# Patient Record
Sex: Female | Born: 1985 | Race: White | Hispanic: No | Marital: Married | Smoking: Never smoker
Health system: Southern US, Community
[De-identification: ages and names within clinical notes are randomized; demographics above are authoritative.]

## PROBLEM LIST (undated history)

## (undated) DIAGNOSIS — G4730 Sleep apnea, unspecified: Secondary | ICD-10-CM

## (undated) DIAGNOSIS — D649 Anemia, unspecified: Secondary | ICD-10-CM

## (undated) HISTORY — PX: NASAL SEPTUM SURGERY: SHX37

## (undated) HISTORY — PX: COLONOSCOPY W/ BIOPSIES AND POLYPECTOMY: SHX1376

---

## 2020-04-05 DIAGNOSIS — K635 Polyp of colon: Secondary | ICD-10-CM | POA: Insufficient documentation

## 2020-04-07 DIAGNOSIS — E559 Vitamin D deficiency, unspecified: Secondary | ICD-10-CM | POA: Insufficient documentation

## 2020-06-23 ENCOUNTER — Emergency Department: Payer: BLUE CROSS/BLUE SHIELD

## 2020-06-23 ENCOUNTER — Other Ambulatory Visit: Payer: Self-pay

## 2020-06-23 DIAGNOSIS — O209 Hemorrhage in early pregnancy, unspecified: Secondary | ICD-10-CM

## 2020-06-23 DIAGNOSIS — O4691 Antepartum hemorrhage, unspecified, first trimester: Secondary | ICD-10-CM | POA: Diagnosis present

## 2020-06-23 DIAGNOSIS — O200 Threatened abortion: Secondary | ICD-10-CM | POA: Insufficient documentation

## 2020-06-23 LAB — URINALYSIS, COMPLETE (UACMP) WITH MICROSCOPIC
Bacteria, UA: NONE SEEN
Bilirubin Urine: NEGATIVE
Glucose, UA: NEGATIVE mg/dL
Ketones, ur: 5 mg/dL — AB
Leukocytes,Ua: NEGATIVE
Nitrite: NEGATIVE
Protein, ur: NEGATIVE mg/dL
RBC / HPF: >50 RBC/hpf — ABNORMAL HIGH (ref 0–5)
Specific Gravity, Urine: 1.023 (ref 1.005–1.030)
pH: 6 (ref 5.0–8.0)

## 2020-06-23 LAB — CBC
HCT: 38 % (ref 36.0–46.0)
Hemoglobin: 12.6 g/dL (ref 12.0–15.0)
MCH: 30.1 pg (ref 26.0–34.0)
MCHC: 33.2 g/dL (ref 30.0–36.0)
MCV: 90.9 fL (ref 80.0–100.0)
Platelets: 306 10*3/uL (ref 150–400)
RBC: 4.18 MIL/uL (ref 3.87–5.11)
RDW: 12.6 % (ref 11.5–15.5)
WBC: 7.6 10*3/uL (ref 4.0–10.5)
nRBC: 0 % (ref 0.0–0.2)

## 2020-06-23 LAB — COMPREHENSIVE METABOLIC PANEL
ALT: 14 U/L (ref 0–44)
AST: 18 U/L (ref 15–41)
Albumin: 4.5 g/dL (ref 3.5–5.0)
Alkaline Phosphatase: 56 U/L (ref 38–126)
Anion gap: 8 (ref 5–15)
BUN: 15 mg/dL (ref 6–20)
CO2: 25 mmol/L (ref 22–32)
Calcium: 9.1 mg/dL (ref 8.9–10.3)
Chloride: 104 mmol/L (ref 98–111)
Creatinine, Ser: 0.71 mg/dL (ref 0.44–1.00)
GFR, Estimated: >60 mL/min (ref >60)
Glucose, Bld: 126 mg/dL — ABNORMAL HIGH (ref 70–99)
Potassium: 3.8 mmol/L (ref 3.5–5.1)
Sodium: 137 mmol/L (ref 135–145)
Total Bilirubin: 0.6 mg/dL (ref 0.3–1.2)
Total Protein: 7 g/dL (ref 6.5–8.1)

## 2020-06-23 LAB — HCG, QUANTITATIVE, PREGNANCY: hCG, Beta Chain, Quant, S: 703 m[IU]/mL — ABNORMAL HIGH (ref <5)

## 2020-06-23 LAB — POC URINE PREG, ED: Preg Test, Ur: POSITIVE — AB

## 2020-06-23 NOTE — ED Triage Notes (Signed)
Pt in with co right groin pain and states has had some vaginal bleeding for 2 weeks. States bleeding got heavier 2-3 days ago and pain has worsened today. Pt took pregnancy test today that was positive.

## 2020-06-24 ENCOUNTER — Emergency Department
Admission: EM | Admit: 2020-06-24 | Discharge: 2020-06-24 | Disposition: A | Discharge status: Home / Self Care | Payer: BLUE CROSS/BLUE SHIELD | Attending: Emergency Medicine | Admitting: Emergency Medicine

## 2020-06-24 DIAGNOSIS — O200 Threatened abortion: Secondary | ICD-10-CM

## 2020-06-24 DIAGNOSIS — O209 Hemorrhage in early pregnancy, unspecified: Secondary | ICD-10-CM

## 2020-06-24 DIAGNOSIS — R102 Pelvic and perineal pain: Secondary | ICD-10-CM

## 2020-06-24 LAB — CHLAMYDIA/NGC RT PCR (ARMC ONLY)
Chlamydia Tr: NOT DETECTED
N gonorrhoeae: NOT DETECTED

## 2020-06-24 LAB — WET PREP, GENITAL
Clue Cells Wet Prep HPF POC: NONE SEEN
Sperm: NONE SEEN
Trich, Wet Prep: NONE SEEN
Yeast Wet Prep HPF POC: NONE SEEN

## 2020-06-24 LAB — ABO/RH: ABO/RH(D): B POS

## 2020-06-24 NOTE — Discharge Instructions (Addendum)
Do not use tampons, douching or have sexual intercourse until seen by your doctor.  Return to the ER for worsening symptoms, soaking more than 1 pad per hour, fainting or other concerns.

## 2020-06-24 NOTE — ED Provider Notes (Signed)
[facility] Emergency Department Provider Note   ____________________________________________   Event Date/Time   First MD Initiated Contact with Patient 06/24/20 0121     (approximate)  I have reviewed the triage vital signs and the nursing notes.   HISTORY  Chief Complaint Groin Pain    HPI Yvette Thompson is a 35 y.o. female G1, P0 who presents to the ED from home with a chief complaint of vaginal bleeding and right adnexal pain.  LMP 05/30/2020.  Reports vaginal bleeding x14 days, heavier and associated with pelvic cramping today.  Took a pregnancy test today which was positive.  Has an appointment with her OB/GYN tomorrow.  Took ibuprofen and Tylenol while awaiting treatment room which has alleviated her pain.  Denies fever, cough, chest pain, shortness of breath, nausea, vomiting or dysuria.     Past medical history None  There are no problems to display for this patient.   Prior to Admission medications   Not on File    Allergies Patient has no allergy information on record.  No family history on file.  Social History    Review of Systems  Constitutional: No fever/chills Eyes: No visual changes. ENT: No sore throat. Cardiovascular: Denies chest pain. Respiratory: Denies shortness of breath. Gastrointestinal: No abdominal pain.  No nausea, no vomiting.  No diarrhea.  No constipation. Genitourinary: Positive for vaginal bleeding.  Negative for dysuria. Musculoskeletal: Negative for back pain. Skin: Negative for rash. Neurological: Negative for headaches, focal weakness or numbness.   ____________________________________________   PHYSICAL EXAM:  VITAL SIGNS: ED Triage Vitals  Enc Vitals Group     BP 06/23/20 2017 140/81     Pulse Rate 06/23/20 2016 88     Resp 06/23/20 2016 18     Temp 06/23/20 2016 99.7 F (37.6 C)     Temp Source 06/23/20 2016 Oral     SpO2 06/23/20 2016 100 %     Weight 06/23/20 2016 170 lb  (77.1 kg)     Height 06/23/20 2016 5\' 7"  (1.702 m)     Head Circumference --      Peak Flow --      Pain Score 06/23/20 2016 7     Pain Loc --      Pain Edu? --      Excl. in GC? --     Constitutional: Alert and oriented. Well appearing and in no acute distress. Eyes: Conjunctivae are normal. PERRL. EOMI. Head: Atraumatic. Nose: No congestion/rhinnorhea. Mouth/Throat: Mucous membranes are moist.   Neck: No stridor.   Cardiovascular: Normal rate, regular rhythm. Grossly normal heart sounds.  Good peripheral circulation. Respiratory: Normal respiratory effort.  No retractions. Lungs CTAB. Gastrointestinal: Soft and nontender to light or deep palpation. No distention. No abdominal bruits. No CVA tenderness. Pelvic: External exam unremarkable without masses, lesions or vesicles.  Speculum exam reveals mild vaginal bleeding.  Cervical os is closed.  Bimanual exam unremarkable. Musculoskeletal: No lower extremity tenderness nor edema.  No joint effusions. Neurologic:  Normal speech and language. No gross focal neurologic deficits are appreciated. No gait instability. Skin:  Skin is warm, dry and intact. No rash noted. Psychiatric: Mood and affect are normal. Speech and behavior are normal.  ____________________________________________   LABS (all labs ordered are listed, but only abnormal results are displayed)  Labs Reviewed  WET PREP, GENITAL - Abnormal; Notable for the following components:      Result Value   WBC, Wet Prep HPF POC RARE (*)  All other components within normal limits  COMPREHENSIVE METABOLIC PANEL - Abnormal; Notable for the following components:   Glucose, Bld 126 (*)    All other components within normal limits  HCG, QUANTITATIVE, PREGNANCY - Abnormal; Notable for the following components:   hCG, Beta Chain, Quant, S 703 (*)    All other components within normal limits  URINALYSIS, COMPLETE (UACMP) WITH MICROSCOPIC - Abnormal; Notable for the following  components:   Color, Urine YELLOW (*)    APPearance HAZY (*)    Hgb urine dipstick LARGE (*)    Ketones, ur 5 (*)    RBC / HPF >50 (*)    All other components within normal limits  POC URINE PREG, ED - Abnormal; Notable for the following components:   Preg Test, Ur POSITIVE (*)    All other components within normal limits  CHLAMYDIA/NGC RT PCR ([facility] ONLY)  CBC  ABO/RH   ____________________________________________  EKG  None ____________________________________________  RADIOLOGY I, Yvette Thompson J, personally viewed and evaluated these images (plain radiographs) as part of my medical decision making, as well as reviewing the written report by the radiologist.  ED MD interpretation: No IUP visualized  Official radiology report(s): US OB LESS THAN 14 WEEKS WITH OB TRANSVAGINAL  Result Date: 06/23/2020 CLINICAL DATA:  Bleeding EXAM: OBSTETRIC <14 WK [place] AND TRANSVAGINAL OB US TECHNIQUE: Both transabdominal and transvaginal ultrasound examinations were performed for complete evaluation of the gestation as well as the maternal uterus, adnexal regions, and pelvic cul-de-sac. Transvaginal technique was performed to assess early pregnancy. COMPARISON:  None. FINDINGS: Intrauterine gestational sac: None Yolk sac:  Not Visualized. Embryo:  Not Visualized. Cardiac Activity: Not Visualized. Heart Rate:   bpm MSD:   mm    w     d CRL:    mm    w    d                  [place] EDC: Subchorionic hemorrhage:  None visualized. Maternal uterus/adnexae: No adnexal mass. Trace cul-de-sac free fluid and fluid in the endometrium. Uterus is retroverted. IMPRESSION: No intrauterine pregnancy visualized. Differential considerations would include early intrauterine pregnancy too early to visualize, spontaneous abortion, or occult ectopic pregnancy. Recommend close clinical followup and serial quantitative beta HCGs and ultrasounds. Electronically Signed   By: Charlett Nose M.D.   On: 06/23/2020 21:21     ____________________________________________   PROCEDURES  Procedure(s) performed (including Critical Care):  Procedures   ____________________________________________   INITIAL IMPRESSION / ASSESSMENT AND PLAN / ED COURSE  As part of my medical decision making, I reviewed the following data within the electronic MEDICAL RECORD NUMBER History obtained from family, Nursing notes reviewed and incorporated, Labs reviewed, Old chart reviewed, Radiograph reviewed and Notes from prior ED visits     35 year old G1, P0 presenting with vaginal bleeding x2 weeks now with pelvic cramping, right greater than left. Differential diagnosis includes, but is not limited to, ovarian cyst, ovarian torsion, acute appendicitis, diverticulitis, urinary tract infection/pyelonephritis, endometriosis, bowel obstruction, colitis, renal colic, gastroenteritis, hernia, fibroids, endometriosis, pregnancy related pain including ectopic pregnancy, etc.  Discussed with patient and spouse laboratory and imaging results.  She has an appointment tomorrow with her OB/GYN for beta hCG.  She knows she is O blood type but unsure positive or negative.  Will obtain ABO/Rh.  Clinical Course as of 06/24/20 0549  Tue Jun 24, 2020  0322 Updated patient and spouse on wet prep and culture results.  Will print all  test results for patient to take to her doctor tomorrow.  Pelvic rest precautions given.  Both verbalized understanding agree with plan of care. [JS]    Clinical Course User Index [JS] Irean Hong, MD     ____________________________________________   FINAL CLINICAL IMPRESSION(S) / ED DIAGNOSES  Final diagnoses:  Threatened miscarriage in early pregnancy  Pelvic pain     ED Discharge Orders    None      *Please note:  Ethelle Ola was evaluated in Emergency Department on 06/24/2020 for the symptoms described in the history of present illness. She was evaluated in the context of the global COVID-19  pandemic, which necessitated consideration that the patient might be at risk for infection with the SARS-CoV-2 virus that causes COVID-19. Institutional protocols and algorithms that pertain to the evaluation of patients at risk for COVID-19 are in a state of rapid change based on information released by regulatory bodies including the CDC and federal and state organizations. These policies and algorithms were followed during the patient's care in the ED.  Some ED evaluations and interventions may be delayed as a result of limited staffing during and the pandemic.*   Note:  This document was prepared using Dragon voice recognition software and may include unintentional dictation errors.   Irean Hong, MD 06/24/20 [phone]

## 2021-02-24 ENCOUNTER — Other Ambulatory Visit: Payer: Self-pay | Admitting: Obstetrics and Gynecology

## 2021-02-24 DIAGNOSIS — N6320 Unspecified lump in the left breast, unspecified quadrant: Secondary | ICD-10-CM

## 2021-03-09 ENCOUNTER — Ambulatory Visit
Admission: RE | Admit: 2021-03-09 | Discharge: 2021-03-09 | Disposition: A | Discharge status: Home / Self Care | Payer: BC Managed Care – PPO | Source: Ambulatory Visit | Attending: Obstetrics and Gynecology | Admitting: Obstetrics and Gynecology

## 2021-03-09 ENCOUNTER — Other Ambulatory Visit: Payer: Self-pay

## 2021-03-09 DIAGNOSIS — N6320 Unspecified lump in the left breast, unspecified quadrant: Secondary | ICD-10-CM | POA: Diagnosis present

## 2021-03-09 DIAGNOSIS — N6310 Unspecified lump in the right breast, unspecified quadrant: Secondary | ICD-10-CM | POA: Insufficient documentation

## 2021-04-20 DIAGNOSIS — O00101 Right tubal pregnancy without intrauterine pregnancy: Secondary | ICD-10-CM | POA: Insufficient documentation

## 2021-07-09 DIAGNOSIS — O09513 Supervision of elderly primigravida, third trimester: Secondary | ICD-10-CM | POA: Insufficient documentation

## 2021-08-06 LAB — OB RESULTS CONSOLE RUBELLA ANTIBODY, IGM: Rubella: NON-IMMUNE/NOT IMMUNE

## 2021-08-06 LAB — OB RESULTS CONSOLE RPR: RPR: NONREACTIVE

## 2021-08-06 LAB — OB RESULTS CONSOLE HEPATITIS B SURFACE ANTIGEN: Hepatitis B Surface Ag: NEGATIVE

## 2021-08-06 LAB — OB RESULTS CONSOLE VARICELLA ZOSTER ANTIBODY, IGG: Varicella: IMMUNE

## 2021-12-16 DIAGNOSIS — G4730 Sleep apnea, unspecified: Secondary | ICD-10-CM | POA: Insufficient documentation

## 2022-01-16 ENCOUNTER — Encounter: Payer: Self-pay | Admitting: Obstetrics and Gynecology

## 2022-01-16 ENCOUNTER — Other Ambulatory Visit: Payer: Self-pay

## 2022-01-16 ENCOUNTER — Observation Stay
Admission: EM | Admit: 2022-01-16 | Discharge: 2022-01-16 | Disposition: A | Discharge status: Home / Self Care | Payer: BC Managed Care – PPO | Source: Home / Self Care | Admitting: Obstetrics and Gynecology

## 2022-01-16 DIAGNOSIS — Z3A35 35 weeks gestation of pregnancy: Secondary | ICD-10-CM | POA: Insufficient documentation

## 2022-01-16 DIAGNOSIS — O1493 Unspecified pre-eclampsia, third trimester: Secondary | ICD-10-CM | POA: Insufficient documentation

## 2022-01-16 DIAGNOSIS — O1414 Severe pre-eclampsia complicating childbirth: Secondary | ICD-10-CM | POA: Diagnosis not present

## 2022-01-16 DIAGNOSIS — Z79899 Other long term (current) drug therapy: Secondary | ICD-10-CM | POA: Insufficient documentation

## 2022-01-16 DIAGNOSIS — O09523 Supervision of elderly multigravida, third trimester: Secondary | ICD-10-CM | POA: Insufficient documentation

## 2022-01-16 HISTORY — DX: Sleep apnea, unspecified: G47.30

## 2022-01-16 HISTORY — DX: Anemia, unspecified: D64.9

## 2022-01-16 LAB — COMPREHENSIVE METABOLIC PANEL
ALT: 33 U/L (ref 0–44)
AST: 35 U/L (ref 15–41)
Albumin: 2.7 g/dL — ABNORMAL LOW (ref 3.5–5.0)
Alkaline Phosphatase: 100 U/L (ref 38–126)
Anion gap: 6 (ref 5–15)
BUN: 11 mg/dL (ref 6–20)
CO2: 22 mmol/L (ref 22–32)
Calcium: 8.4 mg/dL — ABNORMAL LOW (ref 8.9–10.3)
Chloride: 107 mmol/L (ref 98–111)
Creatinine, Ser: 0.77 mg/dL (ref 0.44–1.00)
GFR, Estimated: >60 mL/min (ref >60)
Glucose, Bld: 107 mg/dL — ABNORMAL HIGH (ref 70–99)
Potassium: 4.4 mmol/L (ref 3.5–5.1)
Sodium: 135 mmol/L (ref 135–145)
Total Bilirubin: 0.4 mg/dL (ref 0.3–1.2)
Total Protein: 5.2 g/dL — ABNORMAL LOW (ref 6.5–8.1)

## 2022-01-16 LAB — CBC
HCT: 30.1 % — ABNORMAL LOW (ref 36.0–46.0)
Hemoglobin: 10.1 g/dL — ABNORMAL LOW (ref 12.0–15.0)
MCH: 29.9 pg (ref 26.0–34.0)
MCHC: 33.6 g/dL (ref 30.0–36.0)
MCV: 89.1 fL (ref 80.0–100.0)
Platelets: 190 10*3/uL (ref 150–400)
RBC: 3.38 MIL/uL — ABNORMAL LOW (ref 3.87–5.11)
RDW: 14.2 % (ref 11.5–15.5)
WBC: 8.6 10*3/uL (ref 4.0–10.5)
nRBC: 0 % (ref 0.0–0.2)

## 2022-01-16 LAB — PROTEIN / CREATININE RATIO, URINE
Creatinine, Urine: 208 mg/dL
Protein Creatinine Ratio: 0.35 mg/mg{Cre} — ABNORMAL HIGH (ref 0.00–0.15)
Total Protein, Urine: 73 mg/dL

## 2022-01-16 NOTE — OB Triage Note (Signed)
Discharge instructions reviewed and follow-up care discussed. Pt discharged by CNM and RN. Red flag labor precautions reviewed and all questions answered. Pt stable at the time of discharge with spouse.

## 2022-01-16 NOTE — OB Triage Note (Signed)
Pt is a G2P0 and [redacted]w[redacted]d presenting to L&D with c/o elevated blood pressures at home. Pt denies headache, blurry vision, epigastric pain, LOF, bleeding, ctx and confirms positive fetal movement. Pt states blood pressures at home were "150s/140s over 90s." Initial BP 160/90. Monitors applied and CNM notified of pts arrival.

## 2022-01-16 NOTE — Discharge Summary (Signed)
Yvette Thompson is a 36 y.o. female. She is at 17w0dgestation. Patient's last menstrual period was 03/07/2021. Estimated Date of Delivery: 02/20/22  [facility]  [facility] Rubella non-immune; Recommend MMR postpartum   Chief complaint: elevated BP at home, reports BP last night 150/99, AM 150/90s; Denies HA, RUQ pain and VD.    S: Resting comfortably. no CTX, no VB.no LOF,  Active fetal movement. Denies: HA, visual changes, SOB, or RUQ/epigastric pain  Maternal Medical History:   Past Medical History:  Diagnosis Date   Anemia    Sleep apnea     Past Surgical History:  Procedure Laterality Date   COLONOSCOPY W/ BIOPSIES AND POLYPECTOMY     NASAL SEPTUM SURGERY      Not on File  Prior to Admission medications   Medication Sig Start Date End Date Taking? Authorizing Provider  ferrous sulfate 324 MG TBEC Take 324 mg by mouth daily with breakfast.   Yes [provider]  magnesium oxide (MAG-OX) 400 (240 Mg) MG tablet Take 400 mg by mouth daily.   Yes [provider]  Prenatal Vit-Fe Fumarate-FA (PRENATAL MULTIVITAMIN) TABS tablet Take 1 tablet by mouth daily at 12 noon.   Yes [provider]      Social History: She  reports that she has never smoked. She has never used smokeless tobacco. She reports that she does not currently use drugs after having used the following drugs: Marijuana.  Family History: family history includes Breast cancer in her paternal aunt.   Review of Systems: A full review of systems was performed and negative except as noted in the HPI.     O:  BP (!) 158/93   Pulse 65   Temp 98.1 F (36.7 C) (Oral)   Resp 16   Ht 5' 7"  (1.702 m)   Wt 99.8 kg   LMP 03/07/2021   BMI 34.46 kg/m  Results for orders placed or performed during the hospital encounter of 01/16/22 (from the past 48 hour(s))  Protein / creatinine ratio, urine    Collection Time: 01/16/22  2:40 PM  Result Value Ref Range   Creatinine, Urine 208 mg/dL   Total Protein, Urine 73 mg/dL   Protein Creatinine Ratio 0.35 (H) 0.00 - 0.15 mg/mg[Cre]  CBC   Collection Time: 01/16/22  3:00 PM  Result Value Ref Range   WBC 8.6 4.0 - 10.5 K/uL   RBC 3.38 (L) 3.87 - 5.11 MIL/uL   Hemoglobin 10.1 (L) 12.0 - 15.0 g/dL   HCT 30.1 (L) 36.0 - 46.0 %   MCV 89.1 80.0 - 100.0 fL   MCH 29.9 26.0 - 34.0 pg   MCHC 33.6 30.0 - 36.0 g/dL   RDW 14.2 11.5 - 15.5 %   Platelets 190 150 - 400 K/uL   nRBC 0.0 0.0 - 0.2 %  Comprehensive metabolic panel   Collection Time: 01/16/22  3:00 PM  Result Value Ref Range   Sodium 135 135 - 145 mmol/L   Potassium 4.4 3.5 - 5.1 mmol/L   Chloride 107 98 - 111 mmol/L   CO2 22 22 - 32 mmol/L   Glucose, Bld 107 (H) 70 - 99 mg/dL   BUN 11 6 - 20 mg/dL   Creatinine, Ser 0.77 0.44 - 1.00 mg/dL   Calcium 8.4 (L) 8.9 - 10.3 mg/dL   Total Protein 5.2 (L) 6.5 - 8.1 g/dL   Albumin 2.7 (  L) 3.5 - 5.0 g/dL   AST 35 15 - 41 U/L   ALT 33 0 - 44 U/L   Alkaline Phosphatase 100 38 - 126 U/L   Total Bilirubin 0.4 0.3 - 1.2 mg/dL   GFR, Estimated >60 >60 mL/min   Anion gap 6 5 - 15     Constitutional: NAD, AAOx3  HE/ENT: extraocular movements grossly intact, moist mucous membranes CV: RRR PULM: nl respiratory effort, CTABL     Abd: gravid, non-tender, non-distended, soft      Ext: Non-tender, Nonedematous   Psych: mood appropriate, speech normal Pelvic: deferred  Fetal  monitoring: Cat I Appropriate for GA Baseline: 135bpm Variability: moderate Accelerations: present x >2 Decelerations absent  Toco: q5-87mn    A/P: 36y.o. 359w0dere for antenatal surveillance for Elevated BP, dx Pre-Eclampsia without severe features  Principle Diagnosis:  High risk pregnancy in third trimester  Preterm labor: not present.  Fetal Wellbeing: Reassuring Cat 1 tracing with reactive NST  Elevated P/C ratio; stable CMP and CBC.  Needs twice weekly  NSTs and AFI once weekly until delivery Scheduled IOL at 37.0 on 01/30/22 D/c home stable, precautions reviewed, follow-up as scheduled.    Yvette FoundCNM 01/16/2022  3:51 PM

## 2022-01-19 ENCOUNTER — Inpatient Hospital Stay: Payer: BC Managed Care – PPO | Admitting: Anesthesiology

## 2022-01-19 ENCOUNTER — Inpatient Hospital Stay
Admission: EM | Admit: 2022-01-19 | Discharge: 2022-01-23 | DRG: 787 | Disposition: A | Discharge status: Home / Self Care | Payer: BC Managed Care – PPO

## 2022-01-19 ENCOUNTER — Other Ambulatory Visit: Payer: Self-pay

## 2022-01-19 ENCOUNTER — Encounter: Payer: Self-pay | Admitting: Obstetrics and Gynecology

## 2022-01-19 ENCOUNTER — Encounter: Admission: EM | Disposition: A | Discharge status: Home / Self Care | Payer: Self-pay | Source: Home / Self Care

## 2022-01-19 DIAGNOSIS — G4730 Sleep apnea, unspecified: Secondary | ICD-10-CM | POA: Diagnosis present

## 2022-01-19 DIAGNOSIS — O99354 Diseases of the nervous system complicating childbirth: Secondary | ICD-10-CM | POA: Diagnosis present

## 2022-01-19 DIAGNOSIS — O328XX0 Maternal care for other malpresentation of fetus, not applicable or unspecified: Secondary | ICD-10-CM | POA: Diagnosis present

## 2022-01-19 DIAGNOSIS — O321XX0 Maternal care for breech presentation, not applicable or unspecified: Secondary | ICD-10-CM | POA: Diagnosis present

## 2022-01-19 DIAGNOSIS — O1413 Severe pre-eclampsia, third trimester: Secondary | ICD-10-CM | POA: Diagnosis present

## 2022-01-19 DIAGNOSIS — O1414 Severe pre-eclampsia complicating childbirth: Principal | ICD-10-CM | POA: Diagnosis present

## 2022-01-19 DIAGNOSIS — O9081 Anemia of the puerperium: Secondary | ICD-10-CM | POA: Diagnosis not present

## 2022-01-19 DIAGNOSIS — O09523 Supervision of elderly multigravida, third trimester: Secondary | ICD-10-CM | POA: Diagnosis present

## 2022-01-19 DIAGNOSIS — O99892 Other specified diseases and conditions complicating childbirth: Secondary | ICD-10-CM | POA: Diagnosis present

## 2022-01-19 DIAGNOSIS — Z3A35 35 weeks gestation of pregnancy: Secondary | ICD-10-CM

## 2022-01-19 DIAGNOSIS — Z2839 Other underimmunization status: Secondary | ICD-10-CM

## 2022-01-19 DIAGNOSIS — R000 Tachycardia, unspecified: Secondary | ICD-10-CM | POA: Diagnosis present

## 2022-01-19 DIAGNOSIS — O1493 Unspecified pre-eclampsia, third trimester: Principal | ICD-10-CM | POA: Diagnosis present

## 2022-01-19 DIAGNOSIS — O09899 Supervision of other high risk pregnancies, unspecified trimester: Secondary | ICD-10-CM

## 2022-01-19 DIAGNOSIS — D62 Acute posthemorrhagic anemia: Secondary | ICD-10-CM | POA: Diagnosis not present

## 2022-01-19 LAB — COMPREHENSIVE METABOLIC PANEL
ALT: 33 U/L (ref 0–44)
AST: 31 U/L (ref 15–41)
Albumin: 2.7 g/dL — ABNORMAL LOW (ref 3.5–5.0)
Alkaline Phosphatase: 99 U/L (ref 38–126)
Anion gap: 4 — ABNORMAL LOW (ref 5–15)
BUN: 9 mg/dL (ref 6–20)
CO2: 22 mmol/L (ref 22–32)
Calcium: 8.3 mg/dL — ABNORMAL LOW (ref 8.9–10.3)
Chloride: 108 mmol/L (ref 98–111)
Creatinine, Ser: 0.72 mg/dL (ref 0.44–1.00)
GFR, Estimated: >60 mL/min (ref >60)
Glucose, Bld: 91 mg/dL (ref 70–99)
Potassium: 4 mmol/L (ref 3.5–5.1)
Sodium: 134 mmol/L — ABNORMAL LOW (ref 135–145)
Total Bilirubin: 0.4 mg/dL (ref 0.3–1.2)
Total Protein: 5.3 g/dL — ABNORMAL LOW (ref 6.5–8.1)

## 2022-01-19 LAB — TYPE AND SCREEN
ABO/RH(D): B POS
Antibody Screen: NEGATIVE

## 2022-01-19 LAB — CBC
HCT: 30.5 % — ABNORMAL LOW (ref 36.0–46.0)
Hemoglobin: 10.3 g/dL — ABNORMAL LOW (ref 12.0–15.0)
MCH: 29.9 pg (ref 26.0–34.0)
MCHC: 33.8 g/dL (ref 30.0–36.0)
MCV: 88.7 fL (ref 80.0–100.0)
Platelets: 203 10*3/uL (ref 150–400)
RBC: 3.44 MIL/uL — ABNORMAL LOW (ref 3.87–5.11)
RDW: 14.5 % (ref 11.5–15.5)
WBC: 12 10*3/uL — ABNORMAL HIGH (ref 4.0–10.5)
nRBC: 0 % (ref 0.0–0.2)

## 2022-01-19 LAB — PROTEIN / CREATININE RATIO, URINE
Creatinine, Urine: 37 mg/dL
Protein Creatinine Ratio: 1.41 mg/mg{Cre} — ABNORMAL HIGH (ref 0.00–0.15)
Total Protein, Urine: 52 mg/dL

## 2022-01-19 SURGERY — Surgical Case
Anesthesia: Spinal

## 2022-01-19 MED ORDER — TERBUTALINE SULFATE 1 MG/ML IJ SOLN
INTRAMUSCULAR | Status: AC
  Filled 2022-01-19: qty 1

## 2022-01-19 MED ORDER — ACETAMINOPHEN 10 MG/ML IV SOLN
1000.0000 mg | Freq: Once | INTRAVENOUS | Status: AC
  Administered 2022-01-19: 1000 mg via INTRAVENOUS
  Filled 2022-01-19: qty 100

## 2022-01-19 MED ORDER — SOD CITRATE-CITRIC ACID 500-334 MG/5ML PO SOLN: 30.0000 mL | ORAL | Status: DC | PRN

## 2022-01-19 MED ORDER — SODIUM CHLORIDE 0.9% FLUSH: 50.0000 mL | Freq: Once | INTRAVENOUS | Status: DC

## 2022-01-19 MED ORDER — ONDANSETRON HCL 4 MG/2ML IJ SOLN
INTRAMUSCULAR | Status: DC | PRN
  Administered 2022-01-19: 4 mg via INTRAVENOUS

## 2022-01-19 MED ORDER — SODIUM CHLORIDE (PF) 0.9 % IJ SOLN
INTRAMUSCULAR | Status: AC
  Filled 2022-01-19: qty 50

## 2022-01-19 MED ORDER — FENTANYL CITRATE (PF) 100 MCG/2ML IJ SOLN
INTRAMUSCULAR | Status: DC | PRN
  Administered 2022-01-19: 12.5 ug via INTRATHECAL

## 2022-01-19 MED ORDER — LIDOCAINE HCL (PF) 1 % IJ SOLN: 30.0000 mL | INTRAMUSCULAR | Status: DC | PRN

## 2022-01-19 MED ORDER — SODIUM CHLORIDE 0.9% FLUSH: 3.0000 mL | Freq: Two times a day (BID) | INTRAVENOUS | Status: DC

## 2022-01-19 MED ORDER — DEXAMETHASONE SODIUM PHOSPHATE 10 MG/ML IJ SOLN
INTRAMUSCULAR | Status: DC | PRN
  Administered 2022-01-19: 10 mg via INTRAVENOUS

## 2022-01-19 MED ORDER — LACTATED RINGERS IV SOLN: INTRAVENOUS | Status: DC

## 2022-01-19 MED ORDER — EPHEDRINE SULFATE (PRESSORS) 50 MG/ML IJ SOLN
INTRAMUSCULAR | Status: DC | PRN
  Administered 2022-01-19: 10 mg via INTRAVENOUS

## 2022-01-19 MED ORDER — SODIUM CHLORIDE 0.9 % IV SOLN
500.0000 mg | INTRAVENOUS | Status: DC
  Filled 2022-01-19: qty 5

## 2022-01-19 MED ORDER — MAGNESIUM SULFATE 40 GM/1000ML IV SOLN
2.0000 g/h | INTRAVENOUS | Status: DC
  Administered 2022-01-19: 2 g/h via INTRAVENOUS

## 2022-01-19 MED ORDER — CALCIUM CARBONATE ANTACID 500 MG PO CHEW: 2.0000 | CHEWABLE_TABLET | ORAL | Status: DC | PRN

## 2022-01-19 MED ORDER — LABETALOL HCL 5 MG/ML IV SOLN
40.0000 mg | INTRAVENOUS | Status: DC | PRN
  Administered 2022-01-19: 40 mg via INTRAVENOUS
  Filled 2022-01-19: qty 8

## 2022-01-19 MED ORDER — BUPIVACAINE IN DEXTROSE 0.75-8.25 % IT SOLN
INTRATHECAL | Status: DC | PRN
  Administered 2022-01-19: 1.5 mL via INTRATHECAL

## 2022-01-19 MED ORDER — OXYTOCIN-SODIUM CHLORIDE 30-0.9 UT/500ML-% IV SOLN: 2.5000 [IU]/h | INTRAVENOUS | Status: DC

## 2022-01-19 MED ORDER — LIDOCAINE HCL (PF) 1 % IJ SOLN
INTRAMUSCULAR | Status: AC
  Filled 2022-01-19: qty 30

## 2022-01-19 MED ORDER — AMMONIA AROMATIC IN INHA
RESPIRATORY_TRACT | Status: AC
  Filled 2022-01-19: qty 10

## 2022-01-19 MED ORDER — SODIUM CHLORIDE 0.9 % IV SOLN: 250.0000 mL | INTRAVENOUS | Status: DC | PRN

## 2022-01-19 MED ORDER — PHENYLEPHRINE HCL-NACL 20-0.9 MG/250ML-% IV SOLN
INTRAVENOUS | Status: AC
  Filled 2022-01-19: qty 250

## 2022-01-19 MED ORDER — MORPHINE SULFATE (PF) 0.5 MG/ML IJ SOLN
INTRAMUSCULAR | Status: AC
  Filled 2022-01-19: qty 10

## 2022-01-19 MED ORDER — LABETALOL HCL 5 MG/ML IV SOLN
80.0000 mg | INTRAVENOUS | Status: DC | PRN
  Administered 2022-01-19: 80 mg via INTRAVENOUS
  Filled 2022-01-19: qty 16

## 2022-01-19 MED ORDER — MAGNESIUM SULFATE BOLUS VIA INFUSION
4.0000 g | Freq: Once | INTRAVENOUS | Status: AC
  Filled 2022-01-19: qty 1000

## 2022-01-19 MED ORDER — OXYTOCIN BOLUS FROM INFUSION: 333.0000 mL | Freq: Once | INTRAVENOUS | Status: DC

## 2022-01-19 MED ORDER — ONDANSETRON HCL 4 MG/2ML IJ SOLN: 4.0000 mg | Freq: Four times a day (QID) | INTRAMUSCULAR | Status: DC | PRN

## 2022-01-19 MED ORDER — FENTANYL CITRATE (PF) 100 MCG/2ML IJ SOLN
INTRAMUSCULAR | Status: AC
  Filled 2022-01-19: qty 2

## 2022-01-19 MED ORDER — PHENYLEPHRINE HCL-NACL 20-0.9 MG/250ML-% IV SOLN
INTRAVENOUS | Status: DC | PRN
  Administered 2022-01-19: 20 ug/min via INTRAVENOUS

## 2022-01-19 MED ORDER — ACETAMINOPHEN 500 MG PO TABS: 1000.0000 mg | ORAL_TABLET | Freq: Four times a day (QID) | ORAL | Status: DC | PRN

## 2022-01-19 MED ORDER — HYDRALAZINE HCL 20 MG/ML IJ SOLN
10.0000 mg | INTRAMUSCULAR | Status: DC | PRN
  Administered 2022-01-19: 10 mg via INTRAVENOUS
  Filled 2022-01-19 (×2): qty 1

## 2022-01-19 MED ORDER — CEFAZOLIN SODIUM-DEXTROSE 2-4 GM/100ML-% IV SOLN
2.0000 g | INTRAVENOUS | Status: AC
  Administered 2022-01-19: 2 g via INTRAVENOUS
  Filled 2022-01-19: qty 100

## 2022-01-19 MED ORDER — OXYTOCIN-SODIUM CHLORIDE 30-0.9 UT/500ML-% IV SOLN
INTRAVENOUS | Status: DC | PRN
  Administered 2022-01-19: 299 mL via INTRAVENOUS
  Administered 2022-01-19: 1 mL via INTRAVENOUS

## 2022-01-19 MED ORDER — CEFAZOLIN SODIUM-DEXTROSE 2-4 GM/100ML-% IV SOLN
INTRAVENOUS | Status: AC
  Filled 2022-01-19: qty 100

## 2022-01-19 MED ORDER — BUPIVACAINE HCL (PF) 0.5 % IJ SOLN
30.0000 mL | Freq: Once | INTRAMUSCULAR | Status: DC
  Filled 2022-01-19: qty 30

## 2022-01-19 MED ORDER — LACTATED RINGERS IV SOLN: 500.0000 mL | INTRAVENOUS | Status: DC | PRN

## 2022-01-19 MED ORDER — MAGNESIUM SULFATE 40 GM/1000ML IV SOLN
INTRAVENOUS | Status: AC
  Administered 2022-01-19: 4 g via INTRAVENOUS
  Filled 2022-01-19: qty 1000

## 2022-01-19 MED ORDER — OXYTOCIN 10 UNIT/ML IJ SOLN
INTRAMUSCULAR | Status: AC
  Filled 2022-01-19: qty 2

## 2022-01-19 MED ORDER — MORPHINE SULFATE (PF) 0.5 MG/ML IJ SOLN
INTRAMUSCULAR | Status: DC | PRN
  Administered 2022-01-19: .125 mg via INTRATHECAL

## 2022-01-19 MED ORDER — MISOPROSTOL 200 MCG PO TABS
ORAL_TABLET | ORAL | Status: AC
  Filled 2022-01-19: qty 4

## 2022-01-19 MED ORDER — BUPIVACAINE HCL (PF) 0.5 % IJ SOLN
INTRAMUSCULAR | Status: AC
  Filled 2022-01-19: qty 30

## 2022-01-19 MED ORDER — CEFAZOLIN SODIUM-DEXTROSE 2-3 GM-%(50ML) IV SOLR
INTRAVENOUS | Status: DC | PRN
  Administered 2022-01-19: 2 g via INTRAVENOUS

## 2022-01-19 MED ORDER — LABETALOL HCL 5 MG/ML IV SOLN
20.0000 mg | INTRAVENOUS | Status: DC | PRN
  Administered 2022-01-19: 20 mg via INTRAVENOUS
  Filled 2022-01-19: qty 4

## 2022-01-19 MED ORDER — OXYTOCIN-SODIUM CHLORIDE 30-0.9 UT/500ML-% IV SOLN
INTRAVENOUS | Status: AC
  Filled 2022-01-19: qty 500

## 2022-01-19 MED ORDER — SOD CITRATE-CITRIC ACID 500-334 MG/5ML PO SOLN
30.0000 mL | ORAL | Status: AC
  Administered 2022-01-19: 30 mL via ORAL

## 2022-01-19 MED ORDER — SODIUM CHLORIDE 0.9% FLUSH: 3.0000 mL | INTRAVENOUS | Status: DC | PRN

## 2022-01-19 MED ORDER — ONDANSETRON HCL 4 MG/2ML IJ SOLN
INTRAMUSCULAR | Status: AC
  Filled 2022-01-19: qty 2

## 2022-01-19 MED ORDER — DEXAMETHASONE SODIUM PHOSPHATE 10 MG/ML IJ SOLN
INTRAMUSCULAR | Status: AC
  Filled 2022-01-19: qty 1

## 2022-01-19 MED ORDER — SOD CITRATE-CITRIC ACID 500-334 MG/5ML PO SOLN
ORAL | Status: AC
  Filled 2022-01-19: qty 15

## 2022-01-19 MED ORDER — CALCIUM GLUCONATE 10 % IV SOLN
INTRAVENOUS | Status: AC
  Filled 2022-01-19: qty 10

## 2022-01-19 SURGICAL SUPPLY — 29 items
CHLORAPREP W/TINT 26 (MISCELLANEOUS) ×1 IMPLANT
DRESSING TELFA 8X10 (GAUZE/BANDAGES/DRESSINGS) IMPLANT
DRSG TELFA 3X8 NADH STRL (GAUZE/BANDAGES/DRESSINGS) ×1 IMPLANT
ELECT REM PT RETURN 9FT ADLT (ELECTROSURGICAL) ×1
ELECTRODE REM PT RTRN 9FT ADLT (ELECTROSURGICAL) ×1 IMPLANT
GAUZE SPONGE 4X4 12PLY STRL (GAUZE/BANDAGES/DRESSINGS) ×1 IMPLANT
GOWN STRL REUS W/ TWL LRG LVL3 (GOWN DISPOSABLE) ×3 IMPLANT
GOWN STRL REUS W/TWL LRG LVL3 (GOWN DISPOSABLE) ×3
MANIFOLD NEPTUNE II (INSTRUMENTS) ×1 IMPLANT
MAT PREVALON FULL STRYKER (MISCELLANEOUS) ×1 IMPLANT
NDL HYPO 25GX1X1/2 BEV (NEEDLE) ×1 IMPLANT
NEEDLE HYPO 25GX1X1/2 BEV (NEEDLE) ×1 IMPLANT
NS IRRIG 1000ML POUR BTL (IV SOLUTION) ×1 IMPLANT
PACK C SECTION AR (MISCELLANEOUS) ×1 IMPLANT
PAD ABD 8X10 STRL (GAUZE/BANDAGES/DRESSINGS) IMPLANT
PAD OB MATERNITY 4.3X12.25 (PERSONAL CARE ITEMS) ×1 IMPLANT
PAD PREP 24X41 OB/GYN DISP (PERSONAL CARE ITEMS) ×1 IMPLANT
SCRUB CHG 4% DYNA-HEX 4OZ (MISCELLANEOUS) ×1 IMPLANT
SUT MNCRL 4-0 (SUTURE) ×1
SUT MNCRL 4-0 27XMFL (SUTURE) ×1
SUT VIC AB 0 CT1 36 (SUTURE) ×2 IMPLANT
SUT VIC AB 0 CTX 36 (SUTURE) ×2
SUT VIC AB 0 CTX36XBRD ANBCTRL (SUTURE) ×2 IMPLANT
SUT VIC AB 2-0 SH 27 (SUTURE) ×2
SUT VIC AB 2-0 SH 27XBRD (SUTURE) ×2 IMPLANT
SUTURE MNCRL 4-0 27XMF (SUTURE) ×1 IMPLANT
SYR 30ML LL (SYRINGE) ×2 IMPLANT
TRAP FLUID SMOKE EVACUATOR (MISCELLANEOUS) ×1 IMPLANT
WATER STERILE IRR 500ML POUR (IV SOLUTION) ×1 IMPLANT

## 2022-01-19 NOTE — Consult Note (Signed)
Prenatal Consultation  Ordering Physician: Dr. Dalbert Garnet Consulting Physician: Gerda Diss, NNP-BC for Dr. Carollee Herter Reason For Consult: Prematurity   Assessment/Plan 36 y.o. G2P0010 at [redacted]w[redacted]d presented to L&D for evaluation of Preeclampsia with severe features.  Medical history significant for sleep apnea (uses CPAP) and Rubella non-immune.  Prenatal course significant for AMA and breech presentation.  Evaluation showed Preeclampsia with severe features - treated with Labetalol and Magnesium Sulfate.          History: Yvette Thompson is a 36 y.o. G68P0010 female with new diagnosis of Preeclampsia with Severe features prompting C-Section delivery secondary to breech presentation.   OB History  Gravida Para Term Preterm AB Living  2 0 0 0 1 0  SAB IAB Ectopic Multiple Live Births  0 0 1 0 0    # Outcome Date GA Lbr Len/2nd Weight Sex Delivery Anes PTL Lv  2 Current           1 Ectopic            Past Medical History:  Diagnosis Date   Anemia    Sleep apnea    Past Surgical History:  Procedure Laterality Date   COLONOSCOPY W/ BIOPSIES AND POLYPECTOMY     NASAL SEPTUM SURGERY      Medications:   ammonia       bupivacaine(PF)  30 mL Infiltration Once   bupivacaine(PF)       calcium gluconate       lidocaine (PF)       misoprostol       oxytocin       oxytocin 40 units in LR 1000 mL  333 mL Intravenous Once   Oxytocin-Sodium Chloride       sodium chloride (PF)       sodium chloride flush  3 mL Intravenous Q12H   sodium chloride flush  50 mL Other Once   sodium citrate-citric acid       sodium citrate-citric acid  30 mL Oral 30 min Pre-Op    Family History  Problem Relation Age of Onset   Breast cancer Paternal Aunt    Social History   Socioeconomic History   Marital status: Married    Spouse name: Not on file   Number of children: Not on file   Years of education: Not on file   Highest education level: Not on file  Occupational History   Not on file   Tobacco Use   Smoking status: Never   Smokeless tobacco: Never  Vaping Use   Vaping Use: Never used  Substance and Sexual Activity   Alcohol use: Not on file   Drug use: Not Currently    Types: Marijuana    Comment: Last use 2022 (recreational/occasional)   Sexual activity: Yes    Birth control/protection: Condom  Other Topics Concern   Not on file  Social History Narrative   Not on file   Social Determinants of Health   Financial Resource Strain: Not on file  Food Insecurity: No Food Insecurity (01/19/2022)   Hunger Vital Sign    Worried About Running Out of Food in the Last Year: Never true    Ran Out of Food in the Last Year: Never true  Transportation Needs: No Transportation Needs (01/19/2022)   PRAPARE - Administrator, Civil Service (Medical): No    Lack of Transportation (Non-Medical): No  Physical Activity: Not on file  Stress: Not on file  Social Connections: Not on file  Discussion Topics:  -Resuscitation team and roles -Anticipated Respiratory support ranging from room air to intubation and surfactant administration -Fluids/Nutrition including Breastfeeding with potential need for supplementation and increased calories -Hypoglycemia and potential management    Feeding Preference:  human milk  Consents Reviewed and Signed:  Donor Breast Milk  Comments: Parents counseled on delivery room management and anticipated course of infant born at 95 weeks.  Ranging from normal newborn care to potential need for admission to the SCN if indicated.  Questions encouraged and answered.       I spent 30 minutes reviewing the patient's prenatal and medical records and counseling the patient.  Greater than 50% of the 30 minute visit was spent in counseling/coordination of care for the patient.    Electronically Signed: Rosebud Poles, NNP-BC 01/19/2022 8:07 PM   Supervising physician: Clarnce Flock

## 2022-01-19 NOTE — Op Note (Signed)
  Cesarean Section Procedure Note  Date of procedure: 01/19/2022   Pre-operative Diagnosis: Intrauterine pregnancy at [redacted]w[redacted]d;  - PreE with severe features - footling breech presentation  Post-operative Diagnosis: same, delivered.  Procedure: Primary Low Transverse Cesarean Section through Pfannenstiel incision  Surgeon: Benjaman Kindler, MD  Assistant(s):  Drinda Butts, CNM   Anesthesia: Spinal anesthesia  Anesthesiologist: Arita Miss, MD Anesthesiologist: Arita Miss, MD CRNA: Lendon Colonel, CRNA  Estimated Blood Loss:   949ml         Drains: foley         Total IV Fluids: 855ml  Urine Output: see anesthesia report          Specimens: Cord gas, arterial         Complications:  None; patient tolerated the procedure well.         Disposition: PACU - hemodynamically stable.         Condition: stable  Findings:  A female infant in footling breech presentation. Amniotic fluid - Clear  Birth weight pending.  Apgars of 1 and 8 at one and five minutes respectively.  Intact placenta with a three-vessel cord.  Grossly normal uterus, tubes and ovaries bilaterally.    Procedure Details   The patient was taken to Operating Room, identified as the correct patient and the procedure verified as C-Section Delivery. A formal Time Out was held with all team members present and in agreement.  After induction of spinal anesthesia, the patient was draped and prepped in the usual sterile manner. A Pfannenstiel skin incision was made and carried down through the subcutaneous tissue to the fascia. Fascial incision was made and extended transversely with the Mayo scissors. The fascia was separated from the underlying rectus tissue superiorly and inferiorly. The peritoneum was identified and entered bluntly. Peritoneal incision was extended longitudinally. The utero-vesical peritoneal reflection was incised transversely and a bladder flap was created digitally.   A low transverse hysterotomy  was made. The fetus was delivered in a breech presentation atraumatically, using standard breech manuvers. The umbilical cord was clamped x2 and cut and the infant was handed to the awaiting pediatricians. The placenta was removed intact and appeared normal, intact, and with a 3-vessel cord.   The uterus was exteriorized and cleared of all clot and debris. The hysterotomy was closed with running sutures of 0-Vicryl. A second imbricating layer was placed with the same suture. Excellent hemostasis was observed. The peritoneal cavity was cleared of all clots and debris. The uterus was returned to the abdomen.   The pelvis was irrigated and again, excellent hemostasis was noted. The fascia was then reapproximated with running sutures of 0 Maxon.  The skin was reapproximated with Ensorb and 0.5% bupivicaine along the fascial and skin lines.  Instrument, sponge, and needle counts were correct prior to the abdominal closure and at the conclusion of the case.   The patient tolerated the procedure well and was transferred to the recovery room in stable condition.   Benjaman Kindler, MD 01/19/2022

## 2022-01-19 NOTE — Progress Notes (Signed)
36yo G2P0010 [redacted]w[redacted]d with PreE with severe features on mag, now s/p multiple iv antihypertensives since admission, with a headache and a significant bump in P:C ratio in the last 3 days, presented for iol, but found to be frank breech, head in RUQ with spine anterior to maternal right with an anterior placenta. We discussed trial of ECV with spinal placement briefly as pt and partner desired a low intervention delivery, but ultimately decided to proceed with pLTCS, reserving the possibility of TOLAC in the future.   The risks of cesarean section discussed with the patient included but were not limited to: bleeding which may require transfusion or reoperation; infection which may require antibiotics; injury to bowel, bladder, ureters or other surrounding organs; injury to the fetus; need for additional procedures including hysterectomy in the event of a life-threatening hemorrhage; placental abnormalities wth subsequent pregnancies, incisional problems, thromboembolic phenomenon and other postoperative/anesthesia complications. The patient concurred with the proposed plan, giving informed written consent for the procedure.   Patient has been NPO since 1300 she will remain NPO for procedure. Anesthesia and OR aware. Preoperative prophylactic antibiotics and SCDs ordered on call to the OR.  To OR when ready.

## 2022-01-19 NOTE — Transfer of Care (Signed)
Immediate Anesthesia Transfer of Care Note  Patient: Yvette Thompson  Procedure(s) Performed: CESAREAN SECTION  Patient Location: PACU  Anesthesia Type:Spinal  Level of Consciousness: awake, alert , oriented, and patient cooperative  Airway & Oxygen Therapy: Patient Spontanous Breathing  Post-op Assessment: Report given to RN  Post vital signs: Reviewed and stable  Last Vitals:  Vitals Value Taken Time  BP    Temp    Pulse    Resp    SpO2      Last Pain:  Vitals:   01/19/22 1950  TempSrc: Oral  PainSc:          Complications: No notable events documented.

## 2022-01-19 NOTE — Discharge Instructions (Signed)
Cesarean Delivery, Care After Refer to this sheet in the next few weeks. These instructions provide you with information on caring for yourself after your procedure. Your health care provider may also give you specific instructions. Your treatment has been planned according to current medical practices, but problems sometimes occur. Call your health care provider if you have any problems or questions after you go home. HOME CARE INSTRUCTIONS  Please leave honey comb dressing (OP Site) on for 7 days.  You may shower during this period but turn your back to the water so that the dressing does not get directly saturated by the water.   You may take the dressing off on day 7.  The easiest way to do it is in the shower.  Allow the water to run over the dressing and it usually comes off easier.   Only take over-the-counter or prescription medications as directed by your health care provider. Do not drink alcohol, especially if you are breastfeeding or taking medication to relieve pain. Do not  smoke tobacco. Continue to use good perineal care. Good perineal care includes: Wiping your perineum from front to back. Keeping your perineum clean. Check your surgical cut (incision) daily for increased redness, drainage, swelling, or separation of skin. Shower and clean your incision gently with soap and water every day, by letting warm and soapy water run over the incision, and then pat it dry. If your health care provider says it is okay, leave the incision uncovered. Use a bandage (dressing) if the incision is draining fluid or appears irritated. If the adhesive strips across the incision do not fall off within 7 days, carefully peel them off, after a shower. Hug a pillow when coughing or sneezing until your incision is healed. This helps to relieve pain. Do not use tampons, douches or have sexual intercourse, until your health care provider says it is okay. Wear a well-fitting bra that provides breast  support. Limit wearing support panties or control-top hose. Drink enough fluids to keep your urine clear or pale yellow. Eat high-fiber foods such as whole grain cereals and breads, brown rice, beans, and fresh fruits and vegetables every day. These foods may help prevent or relieve constipation. Resume activities such as climbing stairs, driving, lifting, exercising, or traveling as directed by your health care provider. Try to have someone help you with your household activities and your newborn for at least a few days after you leave the hospital. Rest as much as possible. Try to rest or take a nap when your newborn is sleeping. Increase your activities gradually. Do not lift more than 15lbs until directed by a provider. Keep all of your scheduled postpartum appointments. It is very important to keep your scheduled follow-up appointments. At these appointments, your health care provider will be checking to make sure that you are healing physically and emotionally. SEEK MEDICAL CARE IF:  You are passing large clots from your vagina. Save any clots to show your health care provider. You have a foul smelling discharge from your vagina. You have trouble urinating. You are urinating frequently. You have pain when you urinate. You have a change in your bowel movements. You have increasing redness, pain, or swelling near your incision. You have pus draining from your incision. Your incision is separating. You have painful, hard, or reddened breasts. You have a severe headache. You have blurred vision or see spots. You feel sad or depressed. You have thoughts of hurting yourself or your newborn. You have questions   about your care, the care of your newborn, or medications. You are dizzy or light-headed. You have a rash. You have pain, redness, or swelling at the site of the removed intravenous access (IV) tube. You have nausea or vomiting. You stopped breastfeeding and have not had a  menstrual period within 12 weeks of stopping. You are not breastfeeding and have not had a menstrual period within 12 weeks of delivery. You have a fever. SEEK IMMEDIATE MEDICAL CARE IF: You have persistent pain. You have chest pain. You have shortness of breath. You faint. You have leg pain. You have stomach pain. Your vaginal bleeding saturates 2 or more sanitary pads in 1 hour. MAKE SURE YOU:  Understand these instructions. Will watch your condition. Will get help right away if you are not doing well or get worse. Document Released: 12/12/2001 Document Revised: 08/06/2013 Document Reviewed: 11/17/2011 ExitCare Patient Information 2015 [employer]. This information is not intended to replace advice given to you by your health care provider. Make sure you discuss any questions you have with your health care provider.  

## 2022-01-19 NOTE — Anesthesia Preprocedure Evaluation (Signed)
Anesthesia Evaluation  Patient identified by MRN, date of birth, ID band Patient awake  General Assessment Comment:  Primary C/S for severe preeclampsia + breech presentation. On magnesium  Reviewed: Allergy & Precautions, NPO status , Patient's Chart, lab work & pertinent test results  History of Anesthesia Complications Negative for: history of anesthetic complications  Airway Mallampati: II  TM Distance: >3 FB Neck ROM: Full    Dental  (+) Teeth Intact, Caps   Pulmonary sleep apnea and Continuous Positive Airway Pressure Ventilation , neg COPD, Patient abstained from smoking.Not current smoker   Pulmonary exam normal breath sounds clear to auscultation       Cardiovascular Exercise Tolerance: Good METShypertension, Pt. on medications (-) CAD and (-) Past MI (-) dysrhythmias  Rhythm:Regular Rate:Normal - Systolic murmurs    Neuro/Psych negative neurological ROS  negative psych ROS   GI/Hepatic ,neg GERD  ,,(+)     (-) substance abuse    Endo/Other  neg diabetes    Renal/GU negative Renal ROS     Musculoskeletal   Abdominal  (+) + obese  Peds  Hematology  (+) Blood dyscrasia, anemia No bleeding diatheses, normal platelet count   Anesthesia Other Findings Past Medical History: No date: Anemia No date: Sleep apnea  Reproductive/Obstetrics (+) Pregnancy                             Anesthesia Physical Anesthesia Plan  ASA: 3  Anesthesia Plan: Spinal   Post-op Pain Management:    Induction:   PONV Risk Score and Plan: 4 or greater and Ondansetron and Dexamethasone  Airway Management Planned: Natural Airway  Additional Equipment:   Intra-op Plan:   Post-operative Plan:   Informed Consent: I have reviewed the patients History and Physical, chart, labs and discussed the procedure including the risks, benefits and alternatives for the proposed anesthesia with the patient or  authorized representative who has indicated his/her understanding and acceptance.       Plan Discussed with: CRNA and Surgeon  Anesthesia Plan Comments: (Discussed R/B/A of neuraxial anesthesia technique with patient: - rare risks of spinal/epidural hematoma, nerve damage, infection - Risk of PDPH - Risk of itching - Risk of nausea and vomiting - Risk of conversion to general anesthesia and its associated risks, including sore throat, damage to lips/teeth/oropharynx, and rare risks such as cardiac and respiratory events. - Risk of surgical bleeding requiring blood products - Risk of allergic reactions Discussed the role of CRNA in patient's perioperative care.  Patient voiced understanding.)       Anesthesia Quick Evaluation

## 2022-01-19 NOTE — OB Triage Note (Signed)
Pt G2P0 [redacted]w[redacted]d presents from office for elevated BP's. Pt reports mild HA that started this morning rating 1/10. Reports some floaters that started this AM. Denies epigastric pain. +2 reflexes. No clonus. Denies bleeding, LOF, Ctx. +FM. BP cycling. CNM made aware of pt.

## 2022-01-19 NOTE — Discharge Summary (Signed)
Obstetrical Discharge Summary  Patient Name: Yvette Thompson DOB: 09-17-1985 MRN: [id number]  Date of Admission: 01/19/2022 Date of Delivery: 01/19/2022 Delivered by: Dr. Christeen Thompson  Date of Discharge: 01/23/2022  [facility]: [facility] [id number] last menstrual period was 05/16/2021 (exact date). EDC Estimated Date of Delivery: 02/20/22 Gestational Age at Delivery: [redacted]w[redacted]d   Antepartum complications:  Preeclampsia with severe features  AMA Rubella non-immune  Sleep apnea - uses CPAP  Breech presentation   Admitting Diagnosis: Preeclampsia, third trimester [O14.93] Preeclampsia, severe, third trimester [O14.13]  Secondary Diagnosis: Patient Active Problem List   Diagnosis Date Noted   Preeclampsia, third trimester 01/19/2022   Preeclampsia, severe, third trimester 01/19/2022   AMA (advanced maternal age) multigravida 35+, third trimester 01/19/2022   Rubella non-immune status, antepartum 01/19/2022   Breech presentation 01/19/2022   Observed sleep apnea 12/16/2021   Encounter for supervision of primigravida of advanced maternal age in third trimester 07/09/2021   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  She was started on  magnesium sulfate for seizure prophlaxis. Fetal presenation confirmed breech on [place].  Consented to primary c/section by Dr. Dalbert Thompson.  Please see OP note for further details.  Delivery Type: primary cesarean section, low transverse incision Anesthesia: spinal anesthesia Placenta: manual removal To Pathology: No  Laceration: n/a Episiotomy: none Newborn Data: Live born female  Birth Weight: 6lb 0.3oz  APGAR: 1, 8   Newborn Delivery   Birth  date/time: 01/19/2022 22:58:00 Delivery type: C-Section, Low Transverse Trial of labor: No C-section categorization: Primary      Postpartum Procedures:  Postpartum IV magnesium x 12 hours for pre-eclampsia with severe features Edinburgh:     01/21/2022    2:20 PM  Edinburgh Postnatal Depression Scale Screening Tool  I have been able to laugh and see the funny side of things. 1  I have looked forward with enjoyment to things. 0  I have blamed myself unnecessarily when things went wrong. 2  I have been anxious or worried for no good reason. 2  I have felt scared or panicky for no good reason. 1  Things have been getting on top of me. 1  I have been so unhappy that I have had difficulty sleeping. 1  I have felt sad or miserable. 1  I have been so unhappy that I have been crying. 1  The thought of harming myself has occurred to me. 0  Edinburgh Postnatal Depression Scale Total 10     Post partum course:   Patient had a postpartum course complicated by preeclampsia with severe features.  She received magnesium sulfate infusion for 12 hours postpartum.  Oral antihypertensives were initiated for blood pressure management.  By time of discharge on POD#4, her pain was controlled on oral pain medications; she had appropriate lochia and was ambulating, voiding without difficulty, tolerating regular diet and passing flatus.   She was deemed stable for discharge to home.    Discharge Physical Exam:   BP (!) 139/98 (BP Location: Left Arm)   Pulse 95   Temp 98.2 F (36.8 C) (Oral)   Resp 18   Ht 5\' 7"  (1.702 m)   Wt 90.7 kg   LMP 05/16/2021 (Exact Date)   SpO2 97%  Breastfeeding Unknown   BMI 31.32 kg/m   General: NAD CV: RRR Pulm: CTABL, nl effort ABD: s/nd/nt, fundus firm and below the umbilicus Lochia: moderate Perineum: minimal edema/intact Incision: c/d/I, covered with occlusive OP site dressing   DVT Evaluation: LE non-ttp, no evidence of DVT on exam.  Hemoglobin  Date  Value Ref Range Status  01/22/2022 10.7 (L) 12.0 - 15.0 g/dL Final   HCT  Date Value Ref Range Status  01/22/2022 32.0 (L) 36.0 - 46.0 % Final    Risk assessment for postpartum VTE and prophylactic treatment: Very high risk factors: None High risk factors: None Moderate risk factors: If 3 or more risk factors: mechanical prophylaxis and early ambulation OR 3-6 weeks of LMWH, Cesarean delivery , Preeclampsia , and BMI 30-40 kg/m2  Postpartum VTE prophylaxis with LMWH not indicated  -Mechanical prophylaxis with SCD and early ambulation  Disposition: stable, discharge to home. Baby Feeding: breast feeding Baby Disposition: home with mom  Rh Immune globulin indicated: No Rubella vaccine given: was not indicated Varivax vaccine given: was not indicated Flu vaccine given in AP setting: No Tdap vaccine given in AP setting: Yes   Contraception: no method  Prenatal Labs:  Blood type/Rh B POS Performed at [facility], [address] [place], [place] [address]    Antibody screen Negative    Rubella Nonimmune (05/04 0000)   Varicella Immune  RPR Nonreactive (05/04 0000)   HBsAg Negative (05/04 0000)  Hep C NR   HIV NR    GC neg  Chlamydia neg  Genetic screening cfDNA negative   1 hour GTT 118  3 hour GTT N/A  GBS Pending       Plan:  Yvette Thompson was discharged to home in good condition. Follow-up appointment with [facility] in 3 days for blood pressure check and with delivering provider in 2 weeks.   Discharge Medications: Allergies as of 01/23/2022   Not on File      Medication List     TAKE these medications    acetaminophen 500 MG tablet Commonly known as: TYLENOL Take 2 tablets (1,000 mg total) by mouth every 6 (six) hours.   ferrous sulfate 324 MG Tbec Take 324 mg by mouth daily with breakfast.   ibuprofen 600 MG tablet Commonly known as: ADVIL Take 1 tablet (600 mg total) by mouth every 6 (six) hours.   labetalol 200 MG tablet Commonly  known as: NORMODYNE Take 1 tablet (200 mg total) by mouth 3 (three) times daily.   magnesium oxide 400 (240 Mg) MG tablet Commonly known as: MAG-OX Take 400 mg by mouth daily.   NIFEdipine 90 MG 24 hr tablet Commonly known as: PROCARDIA XL/NIFEDICAL-XL Take 1 tablet (90 mg total) by mouth daily.   oxyCODONE 5 MG immediate release tablet Commonly known as: Oxy IR/ROXICODONE Take 1 tablet (5 mg total) by mouth every 4 (four) hours as needed for moderate pain.   prenatal multivitamin Tabs tablet Take 1 tablet by mouth daily at 12 noon.   senna-docusate 8.6-50 MG tablet Commonly known as: Senokot-S Take 2 tablets by mouth at bedtime as needed for mild constipation.   simethicone 80 MG chewable tablet Commonly known as: MYLICON Chew 1 tablet (80 mg total) by mouth every 6 (six) hours as needed for flatulence.   simethicone 80 MG chewable tablet Commonly known as: MYLICON Chew 1 tablet (80 mg total) by mouth as needed for flatulence.         Follow-up Information  Benjaman Kindler, MD Follow up in 2 week(s).   Specialty: Obstetrics and Gynecology Why: For postop check Contact information: [place] [address] [phone]         [facility] Follow up in 3 day(s).   Why: blood pressure check Contact information: [place] [phone]                Signed:  Gertie Fey, [place] 01/23/2022 9:17 AM

## 2022-01-19 NOTE — H&P (Addendum)
OB History & Physical   History of Present Illness:   Chief Complaint: severe range b/p in office   HPI:  Yvette Thompson is a 36 y.o. G2P0010 female at [redacted]w[redacted]d, Patient's last menstrual period was 05/16/2021 (exact date)., consistent with [place] at [redacted]w[redacted]d, with Estimated Date of Delivery: 02/20/22.  She presents to L&D for evaluation of preeclampsia with severe features. She had 2 severe range BP's in the office of 180/100.  She's had a headache on and off.  Denies RUQ pain.  Denies contractions, LOF, or vaginal bleeding.  Endorses good fetal movement.   Reports active fetal movement  Contractions: denies  LOF/SROM: denies  Vaginal bleeding: denies   Factors complicating pregnancy:  Preeclampsia with severe features  AMA Rubella non-immune  Sleep apnea - uses CPAP  Breech presentation   Patient Active Problem List   Diagnosis Date Noted   Preeclampsia, third trimester 01/19/2022   Preeclampsia, severe, third trimester 01/19/2022   AMA (advanced maternal age) multigravida 35+, third trimester 01/19/2022   Rubella non-immune status, antepartum 01/19/2022   Breech presentation 01/19/2022   Observed sleep apnea 12/16/2021   Encounter for supervision of primigravida of advanced maternal age in third trimester 07/09/2021   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 04/20/2021   Vitamin D deficiency 04/07/2020   Colon polyps 04/05/2020    Prenatal Transfer Tool  Maternal Diabetes: No Genetic Screening: Normal Maternal Ultrasounds/Referrals: Normal Fetal Ultrasounds or other Referrals:  None Maternal Substance Abuse:  No Significant Maternal Medications:  None Significant Maternal Lab Results: None  Maternal Medical History:   Past Medical History:  Diagnosis Date   Anemia    Sleep apnea     Past Surgical History:  Procedure Laterality Date   COLONOSCOPY W/ BIOPSIES AND POLYPECTOMY     NASAL SEPTUM SURGERY      Not on File  Prior to Admission medications    Medication Sig Start Date End Date Taking? Authorizing Provider  ferrous sulfate 324 MG TBEC Take 324 mg by mouth daily with breakfast.   Yes [provider]  magnesium oxide (MAG-OX) 400 (240 Mg) MG tablet Take 400 mg by mouth daily.   Yes [provider]  Prenatal Vit-Fe Fumarate-FA (PRENATAL MULTIVITAMIN) TABS tablet Take 1 tablet by mouth daily at 12 noon.   Yes [provider]     Prenatal care site:  [facility]  OB History  Gravida Para Term Preterm AB Living  2 0 0 0 1 0  SAB IAB Ectopic Multiple Live Births  0 0 1 0 0    # Outcome Date GA Lbr Len/2nd Weight Sex Delivery Anes PTL Lv  2 Current           1 Ectopic              Social History: She  reports that she has never smoked. She has never used smokeless tobacco. She reports that she does not currently use drugs after having used the following drugs: Marijuana.  Family History: family history includes Breast cancer in her paternal aunt.   Review of Systems: A full review of systems was performed and negative except as noted in the HPI.     Physical Exam:  Vital Signs: BP (!) 167/96   Pulse 82   Temp 98.2 F (36.8 C) (Oral)   Resp 17   Ht 5\' 7"  (1.702 m)   Wt 99.8 kg   LMP 05/16/2021 (Exact Date)   BMI 34.46 kg/m  General: no acute distress.  HEENT: normocephalic, atraumatic Heart: regular rate & rhythm Lungs: normal respiratory effort Abdomen: soft, gravid, non-tender;  EFW: 5 lbs  Pelvic:   External: Normal external female genitalia  Cervix: Dilation: Closed / Effacement (%): Thick / Station: -3    Extremities: non-tender, symmetric, 2+ pitting edema bilaterally.  DTRs: 2+/2+  Neurologic: Alert & oriented x 3.    Results for orders placed or performed during the hospital encounter of 01/19/22 (from the past 24 hour(s))  Comprehensive metabolic panel     Status: Abnormal   Collection Time: 01/19/22  4:04 PM  Result Value Ref Range   Sodium 134 (L) 135 - 145  mmol/L   Potassium 4.0 3.5 - 5.1 mmol/L   Chloride 108 98 - 111 mmol/L   CO2 22 22 - 32 mmol/L   Glucose, Bld 91 70 - 99 mg/dL   BUN 9 6 - 20 mg/dL   Creatinine, Ser 0.72 0.44 - 1.00 mg/dL   Calcium 8.3 (L) 8.9 - 10.3 mg/dL   Total Protein 5.3 (L) 6.5 - 8.1 g/dL   Albumin 2.7 (L) 3.5 - 5.0 g/dL   AST 31 15 - 41 U/L   ALT 33 0 - 44 U/L   Alkaline Phosphatase 99 38 - 126 U/L   Total Bilirubin 0.4 0.3 - 1.2 mg/dL   GFR, Estimated >60 >60 mL/min   Anion gap 4 (L) 5 - 15  CBC     Status: Abnormal   Collection Time: 01/19/22  4:04 PM  Result Value Ref Range   WBC 12.0 (H) 4.0 - 10.5 K/uL   RBC 3.44 (L) 3.87 - 5.11 MIL/uL   Hemoglobin 10.3 (L) 12.0 - 15.0 g/dL   HCT 30.5 (L) 36.0 - 46.0 %   MCV 88.7 80.0 - 100.0 fL   MCH 29.9 26.0 - 34.0 pg   MCHC 33.8 30.0 - 36.0 g/dL   RDW 14.5 11.5 - 15.5 %   Platelets 203 150 - 400 K/uL   nRBC 0.0 0.0 - 0.2 %  Protein / creatinine ratio, urine     Status: Abnormal   Collection Time: 01/19/22  4:12 PM  Result Value Ref Range   Creatinine, Urine 37 mg/dL   Total Protein, Urine 52 mg/dL   Protein Creatinine Ratio 1.41 (H) 0.00 - 0.15 mg/mg[Cre]    Pertinent Results:  Prenatal Labs: Blood type/Rh B POS Performed at [facility], [address] [place], [place] [address]    Antibody screen Negative    Rubella Nonimmune (05/04 0000)   Varicella Immune  RPR Nonreactive (05/04 0000)   HBsAg Negative (05/04 0000)  Hep C NR   HIV NR    GC neg  Chlamydia neg  Genetic screening cfDNA negative   1 hour GTT 118  3 hour GTT N/A  GBS Pending      FHT:  FHR: 130 bpm, variability: moderate,  accelerations:  Present,  decelerations:  Absent Category/reactivity:  Category I UC:   Irregular mild contractions   Breech presentation by bedside US    No results found.  Assessment:  Yvette Thompson is a 36 y.o. G2P0010 female at [redacted]w[redacted]d with Preeclampsia with severe features and breech presentation.   Plan:  1. Admit to Labor &  Delivery - consents reviewed and obtained - Dr. Leafy Ro notified of admission and plan of care   2. Fetal Well being  - Fetal Tracing: cat 1 - Group B Streptococcus ppx not indicated: GBS pending, Intact,  will need c/s for breech - Presentation: breech confirmed by [place]   3. Routine OB: - Prenatal labs reviewed, as above - Rh positive - CBC, T&S, RPR on admit - NPO, continuous IV fluids  4. Preeclampsia with severe features  - Will start magnesium sulfate bolus/infusion  - Preeclampsia labs stable  - IV Labetalol/hydralazine given to manage severe range BP's  - Continuous fetal monitoring  - We had a lengthy discussion about the risks of preeclampsia with severe features in pregnancy and recommendations for management along with interventions like magnesium sulfate and IV antihypertensives.  Reviewed that delivery is recommended at this time d/t the severe range blood pressures and increasing risks to both her and her baby.  I also reviewed the risks of a late preterm delivery.  At this gestation there is a greater risk to both mom and baby with expectant management versus a late preterm birth and risk of baby needing NICU support.   5. Primary cesarean section  - Dr. Leafy Ro notified of breech presentation  - NPO as of 1300  - Anesthesia notified  - Will plan for primary c/section around 2100 if baby remains breech  - Yvette Thompson and her spouse are interested in a version if possible.  I reviewed that we would need to discuss this with Dr. Leafy Ro and she would need to evaluate if she was a candidate for an ECV.  An ECV is associated with some risks but for certain pregnant persons it can help turn a breech baby to a cephalic presentation.  Dr. Leafy Ro to evaluate if she a candidate for ECV.   6. Post Partum Planning: - Infant feeding: breast feeding - Contraception:  Undecided  - Tdap vaccine: Given prenatally - 12/03/2021 - Flu vaccine:  due in season   Minda Meo,  [place] 01/19/22 6:16 PM  Drinda Butts, [place] Certified Nurse Midwife [facility]

## 2022-01-20 DIAGNOSIS — O1493 Unspecified pre-eclampsia, third trimester: Secondary | ICD-10-CM

## 2022-01-20 LAB — RPR: RPR Ser Ql: NONREACTIVE

## 2022-01-20 LAB — CBC
HCT: 27.4 % — ABNORMAL LOW (ref 36.0–46.0)
Hemoglobin: 9.3 g/dL — ABNORMAL LOW (ref 12.0–15.0)
MCH: 30 pg (ref 26.0–34.0)
MCHC: 33.9 g/dL (ref 30.0–36.0)
MCV: 88.4 fL (ref 80.0–100.0)
Platelets: 202 10*3/uL (ref 150–400)
RBC: 3.1 MIL/uL — ABNORMAL LOW (ref 3.87–5.11)
RDW: 14.5 % (ref 11.5–15.5)
WBC: 16.1 10*3/uL — ABNORMAL HIGH (ref 4.0–10.5)
nRBC: 0 % (ref 0.0–0.2)

## 2022-01-20 LAB — COMPREHENSIVE METABOLIC PANEL
ALT: 32 U/L (ref 0–44)
AST: 35 U/L (ref 15–41)
Albumin: 2.4 g/dL — ABNORMAL LOW (ref 3.5–5.0)
Alkaline Phosphatase: 85 U/L (ref 38–126)
Anion gap: 6 (ref 5–15)
BUN: 8 mg/dL (ref 6–20)
CO2: 18 mmol/L — ABNORMAL LOW (ref 22–32)
Calcium: 7.2 mg/dL — ABNORMAL LOW (ref 8.9–10.3)
Chloride: 107 mmol/L (ref 98–111)
Creatinine, Ser: 0.84 mg/dL (ref 0.44–1.00)
GFR, Estimated: >60 mL/min (ref >60)
Glucose, Bld: 155 mg/dL — ABNORMAL HIGH (ref 70–99)
Potassium: 4.3 mmol/L (ref 3.5–5.1)
Sodium: 131 mmol/L — ABNORMAL LOW (ref 135–145)
Total Bilirubin: 0.4 mg/dL (ref 0.3–1.2)
Total Protein: 4.9 g/dL — ABNORMAL LOW (ref 6.5–8.1)

## 2022-01-20 LAB — GROUP B STREP BY PCR: Group B strep by PCR: NEGATIVE

## 2022-01-20 LAB — CHLAMYDIA/NGC RT PCR (ARMC ONLY)
Chlamydia Tr: NOT DETECTED
N gonorrhoeae: NOT DETECTED

## 2022-01-20 MED ORDER — SIMETHICONE 80 MG PO CHEW
80.0000 mg | CHEWABLE_TABLET | Freq: Three times a day (TID) | ORAL | Status: DC
  Administered 2022-01-20 – 2022-01-23 (×10): 80 mg via ORAL
  Filled 2022-01-20 (×10): qty 1

## 2022-01-20 MED ORDER — MENTHOL 3 MG MT LOZG: 1.0000 | LOZENGE | OROMUCOSAL | Status: DC | PRN

## 2022-01-20 MED ORDER — IBUPROFEN 600 MG PO TABS
600.0000 mg | ORAL_TABLET | Freq: Four times a day (QID) | ORAL | Status: DC
  Administered 2022-01-20 – 2022-01-23 (×11): 600 mg via ORAL
  Filled 2022-01-20 (×11): qty 1

## 2022-01-20 MED ORDER — MEASLES, MUMPS & RUBELLA VAC IJ SOLR
0.5000 mL | Freq: Once | INTRAMUSCULAR | Status: DC
  Filled 2022-01-20: qty 0.5

## 2022-01-20 MED ORDER — OXYCODONE HCL 5 MG PO TABS
5.0000 mg | ORAL_TABLET | ORAL | Status: DC | PRN
  Administered 2022-01-21: 5 mg via ORAL
  Filled 2022-01-20: qty 2

## 2022-01-20 MED ORDER — SIMETHICONE 80 MG PO CHEW
80.0000 mg | CHEWABLE_TABLET | ORAL | Status: DC | PRN
  Administered 2022-01-20 (×2): 80 mg via ORAL
  Filled 2022-01-20 (×2): qty 1

## 2022-01-20 MED ORDER — 0.9 % SODIUM CHLORIDE (POUR BTL) OPTIME
TOPICAL | Status: DC | PRN
  Administered 2022-01-20: 800 mL

## 2022-01-20 MED ORDER — OXYTOCIN-SODIUM CHLORIDE 30-0.9 UT/500ML-% IV SOLN: 2.5000 [IU]/h | INTRAVENOUS | Status: AC

## 2022-01-20 MED ORDER — FERROUS SULFATE 325 (65 FE) MG PO TABS
325.0000 mg | ORAL_TABLET | Freq: Two times a day (BID) | ORAL | Status: DC
  Administered 2022-01-20 – 2022-01-23 (×7): 325 mg via ORAL
  Filled 2022-01-20 (×7): qty 1

## 2022-01-20 MED ORDER — BISACODYL 10 MG RE SUPP: 10.0000 mg | Freq: Every day | RECTAL | Status: DC | PRN

## 2022-01-20 MED ORDER — NIFEDIPINE ER OSMOTIC RELEASE 30 MG PO TB24
30.0000 mg | ORAL_TABLET | Freq: Every day | ORAL | Status: DC
  Administered 2022-01-20: 30 mg via ORAL
  Filled 2022-01-20: qty 1

## 2022-01-20 MED ORDER — LABETALOL HCL 5 MG/ML IV SOLN: 40.0000 mg | INTRAVENOUS | Status: DC | PRN

## 2022-01-20 MED ORDER — FLEET ENEMA 7-19 GM/118ML RE ENEM: 1.0000 | ENEMA | Freq: Every day | RECTAL | Status: DC | PRN

## 2022-01-20 MED ORDER — HYDRALAZINE HCL 20 MG/ML IJ SOLN
5.0000 mg | INTRAMUSCULAR | Status: DC | PRN
  Administered 2022-01-20: 5 mg via INTRAVENOUS

## 2022-01-20 MED ORDER — LABETALOL HCL 5 MG/ML IV SOLN: 20.0000 mg | INTRAVENOUS | Status: DC | PRN

## 2022-01-20 MED ORDER — SENNOSIDES-DOCUSATE SODIUM 8.6-50 MG PO TABS
2.0000 | ORAL_TABLET | ORAL | Status: DC
  Administered 2022-01-20 – 2022-01-23 (×3): 2 via ORAL
  Filled 2022-01-20 (×3): qty 2

## 2022-01-20 MED ORDER — KETOROLAC TROMETHAMINE 30 MG/ML IJ SOLN
30.0000 mg | Freq: Four times a day (QID) | INTRAMUSCULAR | Status: AC
  Administered 2022-01-20 (×2): 30 mg via INTRAVENOUS
  Filled 2022-01-20 (×2): qty 1

## 2022-01-20 MED ORDER — ACETAMINOPHEN 500 MG PO TABS
1000.0000 mg | ORAL_TABLET | Freq: Four times a day (QID) | ORAL | Status: DC
  Administered 2022-01-20 – 2022-01-23 (×13): 1000 mg via ORAL
  Filled 2022-01-20 (×13): qty 2

## 2022-01-20 MED ORDER — COCONUT OIL OIL: 1.0000 | TOPICAL_OIL | Status: DC | PRN

## 2022-01-20 MED ORDER — BUPIVACAINE HCL (PF) 0.5 % IJ SOLN
INTRAMUSCULAR | Status: DC | PRN
  Administered 2022-01-20: 60 mL

## 2022-01-20 MED ORDER — TETANUS-DIPHTH-ACELL PERTUSSIS 5-2.5-18.5 LF-MCG/0.5 IM SUSY
0.5000 mL | PREFILLED_SYRINGE | Freq: Once | INTRAMUSCULAR | Status: DC
  Filled 2022-01-20: qty 0.5

## 2022-01-20 MED ORDER — WITCH HAZEL-GLYCERIN EX PADS: 1.0000 | MEDICATED_PAD | CUTANEOUS | Status: DC | PRN

## 2022-01-20 MED ORDER — DIBUCAINE (PERIANAL) 1 % EX OINT: 1.0000 | TOPICAL_OINTMENT | CUTANEOUS | Status: DC | PRN

## 2022-01-20 MED ORDER — GABAPENTIN 300 MG PO CAPS
300.0000 mg | ORAL_CAPSULE | Freq: Every day | ORAL | Status: DC
  Administered 2022-01-20 – 2022-01-22 (×3): 300 mg via ORAL
  Filled 2022-01-20 (×3): qty 1

## 2022-01-20 MED ORDER — HYDRALAZINE HCL 20 MG/ML IJ SOLN: 10.0000 mg | INTRAMUSCULAR | Status: DC | PRN

## 2022-01-20 MED ORDER — MAGNESIUM SULFATE 40 GM/1000ML IV SOLN: 2.0000 g/h | INTRAVENOUS | Status: DC

## 2022-01-20 MED ORDER — DIPHENHYDRAMINE HCL 25 MG PO CAPS: 25.0000 mg | ORAL_CAPSULE | Freq: Four times a day (QID) | ORAL | Status: DC | PRN

## 2022-01-20 MED ORDER — PRENATAL MULTIVITAMIN CH
1.0000 | ORAL_TABLET | Freq: Every day | ORAL | Status: DC
  Administered 2022-01-20 – 2022-01-23 (×4): 1 via ORAL
  Filled 2022-01-20 (×4): qty 1

## 2022-01-20 MED ORDER — LACTATED RINGERS IV SOLN: INTRAVENOUS | Status: DC

## 2022-01-20 NOTE — Progress Notes (Signed)
Post Partum Day 1  Subjective: Doing well, no concerns. Ambulating without difficulty, pain managed with PO meds, tolerating regular diet, and voiding without difficulty.   No fever/chills, chest pain, shortness of breath, nausea/vomiting, or leg pain. No nipple or breast pain. No headache, visual changes, or RUQ/epigastric pain.  Objective: BP 138/87 (BP Location: Right Arm)   Pulse 83   Temp 98.2 F (36.8 C) (Oral)   Resp 17   Ht 5\' 7"  (1.702 m)   Wt 99.8 kg   LMP 05/16/2021 (Exact Date)   SpO2 97%   Breastfeeding Unknown   BMI 34.46 kg/m    Physical Exam:  General: alert and cooperative Breasts: soft/nontender CV: RRR Pulm: nl effort Abdomen: soft, non-tender Uterine Fundus: firm Incision: Pressure dressing in place with no noted drainage Perineum: intact Lochia: appropriate DVT Evaluation: No evidence of DVT seen on physical exam. Edinburgh:      No data to display           Recent Labs    01/19/22 1604 01/20/22 0624  HGB 10.3* 9.3*  HCT 30.5* 27.4*  WBC 12.0* 16.1*  PLT 203 202    Assessment/Plan: 36 y.o. [id number] postpartum day # 1  Pre-E Blood pressure range: 129-164 / 75-101 01/19/22 1612 PCR 1410; AST/ALT/Cr WNL; Plt WNL 01/20/22 0624 AST/ALT/Cr WNL; Plt WNL Magnesium Sulfate since delivery Output 2201-0700 = 1315 at 170mL/hr;   0701-0800 853mL  -Continue routine postpartum care -Lactation consult PRN for breastfeeding  -Acute blood loss anemia - hemodynamically stable and asymptomatic; start PO ferrous sulfate BID with stool softeners   Disposition: Continue inpatient postpartum care   LOS: 1 day   [place], CNM 01/20/2022, 12:20 PM

## 2022-01-20 NOTE — Anesthesia Postprocedure Evaluation (Signed)
Anesthesia Post Note  Patient: Yvette Thompson  Procedure(s) Performed: [place]  Patient location during evaluation: Mother Baby Anesthesia Type: Spinal Level of consciousness: oriented and awake and alert Pain management: pain level controlled Vital Signs Assessment: post-procedure vital signs reviewed and stable Respiratory status: spontaneous breathing and respiratory function stable Cardiovascular status: blood pressure returned to baseline and stable Postop Assessment: no headache, no backache, no apparent nausea or vomiting and able to ambulate Anesthetic complications: no   No notable events documented.   Last Vitals:  Vitals:   01/20/22 0717 01/20/22 0800  BP: 132/80 131/82  Pulse: 81 79  Resp: 19 17  Temp: 36.7 C 36.8 C  SpO2: 95% 95%    Last Pain:  Vitals:   01/20/22 0800  TempSrc: Oral  PainSc:                  Jerrye Noble

## 2022-01-20 NOTE — Anesthesia Post-op Follow-up Note (Signed)
  Anesthesia Pain Follow-up Note  Patient: Yvette Thompson  Day #: 1  Date of Follow-up: 01/20/2022 Time: 8:25 AM  Last Vitals:  Vitals:   01/20/22 0717 01/20/22 0800  BP: 132/80 131/82  Pulse: 81 79  Resp: 19 17  Temp: 36.7 C 36.8 C  SpO2: 95% 95%    Level of Consciousness: alert  Pain: none   Side Effects:None  Catheter Site Exam:clean, dry, no drainage     Plan: D/C from anesthesia care at surgeon's request  Jerrye Noble

## 2022-01-20 NOTE — Lactation Note (Signed)
This note was copied from a baby's chart. Lactation Consultation Note  Patient Name: Yvette Thompson [id number] Date: 01/20/2022 Reason for consult: Initial assessment;Late-preterm 34-36.6wks;Infant < 6lbs;NICU baby Age:37 hours  Maternal Data Does the patient have breastfeeding experience prior to this delivery?: No  P1, delivered via c-section @ [redacted]w[redacted]d due to severe pre-eclampsia and baby in breech presentation.  Feeding Mother's Current Feeding Choice: Breast Milk  Parents prefer breastfeeding. Baby receiving donor breastmilk at this time.  LATCH Score   Baby in SCN  Lactation Tools Discussed/Used Tools: Pump Breast pump type: Double-Electric Breast Pump Pump Education: Setup, frequency, and cleaning;Milk Storage (reiterated from pervious education) Reason for Pumping: pre-term, SCN baby Pumping frequency: q 3 hours  Reviewed with mom pump set-up, use, frequency, and cleaning. Education given on the importance of frequent stimulation, milk supply and demand, and the normal course of lactation. Educated on hand expression and encouraged to express before and after for optimal colostrum removal.  Interventions Interventions: Breast feeding basics reviewed;Hand express;DEBP;Education  Discharge Pump: Personal (Unsure of the brand)  Consult Status Consult Status: NICU follow-up  Encouraged to call out with questions/concerns or assistance with pumping if needed.  Lavonia Drafts 01/20/2022, 3:08 PM

## 2022-01-20 NOTE — Anesthesia Procedure Notes (Signed)
Spinal  Patient location during procedure: OR End time: 01/19/2022 10:32 PM Reason for block: surgical anesthesia Staffing Performed: anesthesiologist  Anesthesiologist: Arita Miss, MD Performed by: Arita Miss, MD Authorized by: Arita Miss, MD   Preanesthetic Checklist Completed: patient identified, IV checked, site marked, risks and benefits discussed, surgical consent, monitors and equipment checked, pre-op evaluation and timeout performed Spinal Block Patient position: sitting Prep: ChloraPrep and site prepped and draped Patient monitoring: heart rate, continuous pulse ox, blood pressure and cardiac monitor Approach: midline Location: L3-4 Injection technique: single-shot Needle Needle type: Whitacre and Introducer  Needle gauge: 24 G Needle length: 9 cm Assessment Sensory level: T10 Events: CSF return Additional Notes Meticulous sterile technique used throughout (CHG prep, sterile gloves, sterile drape). Negative paresthesia. Negative blood return. Positive free-flowing CSF. Expiration date of kit checked and confirmed. Patient tolerated procedure well, without complications.

## 2022-01-21 MED ORDER — NIFEDIPINE ER OSMOTIC RELEASE 30 MG PO TB24
60.0000 mg | ORAL_TABLET | Freq: Every day | ORAL | Status: DC
  Administered 2022-01-21: 60 mg via ORAL
  Filled 2022-01-21: qty 2

## 2022-01-21 MED ORDER — NIFEDIPINE ER OSMOTIC RELEASE 30 MG PO TB24
90.0000 mg | ORAL_TABLET | Freq: Every day | ORAL | Status: DC
  Administered 2022-01-22 – 2022-01-23 (×2): 90 mg via ORAL
  Filled 2022-01-21 (×2): qty 3

## 2022-01-21 MED ORDER — NIFEDIPINE ER OSMOTIC RELEASE 30 MG PO TB24
30.0000 mg | ORAL_TABLET | Freq: Once | ORAL | Status: AC
  Administered 2022-01-21: 30 mg via ORAL
  Filled 2022-01-21: qty 1

## 2022-01-21 NOTE — Progress Notes (Signed)
Post Partum Day 2 Subjective: Doing well, no complaints.  Tolerating regular diet, pain with PO meds, voiding and ambulating without difficulty.  No CP SOB Fever,Chills, N/V or leg pain; denies nipple or breast pain, no HA change of vision, RUQ/epigastric pain  Objective: BP (!) 141/96 (BP Location: Right Arm)   Pulse 96   Temp 98.4 F (36.9 C) (Oral)   Resp 20   Ht 5' 7"  (1.702 m)   Wt 98.8 kg   LMP 05/16/2021 (Exact Date)   SpO2 96%   Breastfeeding Unknown   BMI 34.11 kg/m    Physical Exam:  General: NAD Breasts: soft/nontender CV: RRR Pulm: nl effort, CTABL Abdomen: soft, NT, BS x 4 Incision: Dsg CDI/Steristrips intact/no erythema or drainage Lochia: moderate Uterine Fundus: fundus firm and 1 fb below umbilicus DVT Evaluation: no cords, ttp LEs   Recent Labs    01/19/22 1604 01/20/22 0624  HGB 10.3* 9.3*  HCT 30.5* 27.4*  WBC 12.0* 16.1*  PLT 203 202    Assessment/Plan: 36 y.o. [id number] postpartum day # 1  - Continue routine PP care, baby is still in SCN. - Lactation consult prn - Discussed contraceptive options including implant, IUDs hormonal and non-hormonal, injection, pills/ring/patch, condoms, and NFP.  - Acute blood loss anemia - hemodynamically stable and asymptomatic; start po ferrous sulfate BID with stool softeners  - Immunization status: Needs MMR prior to DC  Disposition: Does not desire Dc home today.   Gertie Fey, CNM 01/21/2022 9:09 AM

## 2022-01-22 ENCOUNTER — Inpatient Hospital Stay: Payer: BC Managed Care – PPO

## 2022-01-22 LAB — CBC WITH DIFFERENTIAL/PLATELET
Abs Immature Granulocytes: 0.19 10*3/uL — ABNORMAL HIGH (ref 0.00–0.07)
Basophils Absolute: 0.1 10*3/uL (ref 0.0–0.1)
Basophils Relative: 1 %
Eosinophils Absolute: 0.6 10*3/uL — ABNORMAL HIGH (ref 0.0–0.5)
Eosinophils Relative: 5 %
HCT: 32 % — ABNORMAL LOW (ref 36.0–46.0)
Hemoglobin: 10.7 g/dL — ABNORMAL LOW (ref 12.0–15.0)
Immature Granulocytes: 2 %
Lymphocytes Relative: 16 %
Lymphs Abs: 1.9 10*3/uL (ref 0.7–4.0)
MCH: 30.6 pg (ref 26.0–34.0)
MCHC: 33.4 g/dL (ref 30.0–36.0)
MCV: 91.4 fL (ref 80.0–100.0)
Monocytes Absolute: 0.5 10*3/uL (ref 0.1–1.0)
Monocytes Relative: 5 %
Neutro Abs: 8.4 10*3/uL — ABNORMAL HIGH (ref 1.7–7.7)
Neutrophils Relative %: 71 %
Platelets: 275 10*3/uL (ref 150–400)
RBC: 3.5 MIL/uL — ABNORMAL LOW (ref 3.87–5.11)
RDW: 15.5 % (ref 11.5–15.5)
WBC: 11.7 10*3/uL — ABNORMAL HIGH (ref 4.0–10.5)
nRBC: 0 % (ref 0.0–0.2)

## 2022-01-22 LAB — COMPREHENSIVE METABOLIC PANEL
ALT: 36 U/L (ref 0–44)
AST: 39 U/L (ref 15–41)
Albumin: 2.8 g/dL — ABNORMAL LOW (ref 3.5–5.0)
Alkaline Phosphatase: 88 U/L (ref 38–126)
Anion gap: 6 (ref 5–15)
BUN: 9 mg/dL (ref 6–20)
CO2: 24 mmol/L (ref 22–32)
Calcium: 8.8 mg/dL — ABNORMAL LOW (ref 8.9–10.3)
Chloride: 110 mmol/L (ref 98–111)
Creatinine, Ser: 0.61 mg/dL (ref 0.44–1.00)
GFR, Estimated: >60 mL/min (ref >60)
Glucose, Bld: 82 mg/dL (ref 70–99)
Potassium: 4 mmol/L (ref 3.5–5.1)
Sodium: 140 mmol/L (ref 135–145)
Total Bilirubin: 0.5 mg/dL (ref 0.3–1.2)
Total Protein: 5.7 g/dL — ABNORMAL LOW (ref 6.5–8.1)

## 2022-01-22 LAB — BRAIN NATRIURETIC PEPTIDE: B Natriuretic Peptide: 89.5 pg/mL (ref 0.0–100.0)

## 2022-01-22 MED ORDER — LABETALOL HCL 200 MG PO TABS
200.0000 mg | ORAL_TABLET | Freq: Three times a day (TID) | ORAL | Status: DC
  Administered 2022-01-22 – 2022-01-23 (×4): 200 mg via ORAL
  Filled 2022-01-22 (×5): qty 1

## 2022-01-22 MED ORDER — LABETALOL HCL 200 MG PO TABS
200.0000 mg | ORAL_TABLET | Freq: Two times a day (BID) | ORAL | Status: DC
  Administered 2022-01-22: 200 mg via ORAL
  Filled 2022-01-22: qty 1

## 2022-01-22 MED ORDER — FUROSEMIDE 10 MG/ML IJ SOLN
20.0000 mg | Freq: Once | INTRAMUSCULAR | Status: AC
  Administered 2022-01-22: 20 mg via INTRAVENOUS
  Filled 2022-01-22: qty 2

## 2022-01-22 NOTE — Progress Notes (Signed)
Post Partum Day 3  Subjective: Doing well, no complaints.  Tolerating regular diet, pain with PO meds, voiding and ambulating without difficulty.  No CP SOB Fever,Chills, N/V or leg pain; denies nipple or breast pain, no HA change of vision, RUQ/epigastric pain  - reports significant edema to LE, reports no chest pain or tightness, but feels that she isn't taking deep enough breaths - Generally feels fatigued, but otherwise feels fine.   Objective: BP (!) 149/104 (BP Location: Left Arm) Comment: nurse Anna notified  Pulse 100   Temp 98.5 F (36.9 C) (Oral)   Resp 20   Ht 5' 7"  (1.702 m)   Wt 93.6 kg   LMP 05/16/2021 (Exact Date)   SpO2 96%   Breastfeeding Unknown   BMI 32.33 kg/m   Vitals:   01/20/22 2357 01/21/22 0323 01/21/22 0808 01/21/22 1154  BP: (!) 145/83 (!) 135/96 (!) 141/96 (!) 149/93   01/21/22 1500 01/21/22 1934 01/22/22 0021 01/22/22 0349  BP: 139/82 (!) 153/89 (!) 142/93 (!) 166/99   01/22/22 0350 01/22/22 0500 01/22/22 0735 01/22/22 0739  BP: (!) 143/96 (!) 147/87 (!) 150/106 (!) 149/104      Physical Exam:  General: NAD Breasts: soft/nontender CV: RRR Pulm: nl effort, crackles noted in lung bases.  Abdomen: soft, NT, BS x 4 Incision: honeycomb Dsg CDI, no erythema or drainage Lochia: moderate Uterine Fundus: fundus firm and 1 fb below umbilicus DVT Evaluation: no cords, ttp LEs   Recent Labs    01/20/22 0624 01/22/22 0847  HGB 9.3* 10.7*  HCT 27.4* 32.0*  WBC 16.1* 11.7*  PLT 202 275    Assessment/Plan: 36 y.o. G2 P0111 postpartum day # 3  - Continue routine PP care, baby is still in SCN. - Lactation consult prn, discussed timing of milk coming in with preterm, CS and Pre-E - Pre-E: s/p Magnesium, on Procardia 40m xl daily, started Labetalol this AM.  - new onset tachycardia this morning, continued mild range BPs 140-150/90-105s; pt without acute sx of CP or SOB.  - reviewed with Dr SOuida Sills CXR results and EKG results reviewed,  pending CMP, CBC and BNP reviewed. Given Lasix per orders from Dr SOuida Sills   - Immunization status: Needs MMR prior to DC  Disposition: Does not desire Dc home today.   RFrancetta Found CNM 01/22/2022 10:06 AM

## 2022-01-22 NOTE — Progress Notes (Signed)
Pt had one elevated BP of 160s/90s at 4am. Re-cycled check was 140s/90s. Pt complained of a slight headache with a 2/10 pain. No other severe features noted. Tylenol and motrin were both given.   Pt no longer complaining of a headache at 5am. No other features were reported. Recheck of BP was 140s/80s.

## 2022-01-22 NOTE — Lactation Note (Signed)
This note was copied from a baby's chart. Lactation Consultation Note  Patient Name: Yvette Thompson [id number] Date: 01/22/2022 Reason for consult: Follow-up assessment;Late-preterm 34-36.6wks;NICU baby;Breastfeeding assistance;Mother's request Age:37 hours  Maternal Data Has patient been taught Hand Expression?: Yes Does the patient have breastfeeding experience prior to this delivery?: No  Mom has been pumping routinely. Breasts are starting to swell and become heavy. Encouraged recommended breast care to include ice pre pumping and ice post pumping for the swelling, light/gentle massage.  Feeding Mother's Current Feeding Choice: Breast Milk  LATCH Score Latch: Grasps breast easily, tongue down, lips flanged, rhythmical sucking.  Audible Swallowing: A few with stimulation (breast massage/compression into the shield)  Type of Nipple: Everted at rest and after stimulation  Comfort (Breast/Nipple): Soft / non-tender  Hold (Positioning): Assistance needed to correctly position infant at breast and maintain latch.  LATCH Score: 8  Baby has been going to the breast, overnight a nipple shield was placed and used to help achieve and sustain a latch. Attempts made without shield first, then with LC at bedside, nipple shield was placed and baby accepted easily. Continual stimulation required, however, baby still was not very active. It appeared the only swallows was due breast massage/compression into the shield.  Lactation Tools Discussed/Used Tools: Pump Nipple shield size: 20 Breast pump type: Double-Electric Breast Pump Reason for Pumping: SCN Pumping frequency: q 3 hrs  Interventions Interventions: Breast feeding basics reviewed;Assisted with latch;Hand express;Breast compression;Support pillows;Adjust position;Education (use of nipple shield at the breast)  Discharge Pump: Personal  Consult Status Consult Status: PRN    Lavonia Drafts 01/22/2022, 11:32 AM

## 2022-01-22 NOTE — TOC Initial Note (Signed)
Transition of Care [facility]) - Initial/Assessment Note    Patient Details  Name: Yvette Thompson MRN: [id number] Date of Birth: 08-26-1985  [facility] CM/SW Contact:    Alberteen Sam, LCSW Phone Number: 01/22/2022, 10:49 AM  Clinical Narrative:                  TOC consulted for depression scale of 10. CSW met with patient at bedside who reports she is feeling a little overwhelmed however does see a therapist regularly. She reports seeing Pilar Jarvis who is based in [place] but her therapy sessions are via [place]. Reports she has a therapy session scheduled for next week to address current feelings. No SI/HI. Patient reports she is not currently on medication for depression. Patient identifies no needs for discharge at this time, reports having supportive husband.    Expected Discharge Plan: Home/Self Care Barriers to Discharge: Continued Medical Work up   Patient Goals and CMS Choice Patient states their goals for this hospitalization and ongoing recovery are:: to go home CMS Medicare.gov Compare Post Acute Care list provided to:: Patient Choice offered to / list presented to : Patient  Expected Discharge Plan and Services Expected Discharge Plan: Home/Self Care                                              Prior Living Arrangements/Services                       Activities of Daily Living Home Assistive Devices/Equipment: None ADL Screening (condition at time of admission) Patient's cognitive ability adequate to safely complete daily activities?: No Is the patient deaf or have difficulty hearing?: No Does the patient have difficulty seeing, even when wearing glasses/contacts?: No Does the patient have difficulty concentrating, remembering, or making decisions?: No Patient able to express need for assistance with ADLs?: Yes Does the patient have difficulty dressing or bathing?: No Independently performs ADLs?: Yes (appropriate for  developmental age) Does the patient have difficulty walking or climbing stairs?: No Weakness of Legs: None Weakness of Arms/Hands: None  Permission Sought/Granted                  Emotional Assessment       Orientation: : Oriented to Self, Oriented to Place, Oriented to  Time, Oriented to Situation Alcohol / Substance Use: Not Applicable Psych Involvement: No (comment)  Admission diagnosis:  Preeclampsia, third trimester [O14.93] Preeclampsia, severe, third trimester [O14.13] Patient Active Problem List   Diagnosis Date Noted   Preeclampsia, third trimester 01/19/2022   Preeclampsia, severe, third trimester 01/19/2022   AMA (advanced maternal age) multigravida 35+, third trimester 01/19/2022   Rubella non-immune status, antepartum 01/19/2022   Breech presentation 01/19/2022   Observed sleep apnea 12/16/2021   Encounter for supervision of primigravida of advanced maternal age in third trimester 07/09/2021   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           

## 2022-01-22 NOTE — Progress Notes (Signed)
CNM aware of pt SpO2 ranging between 90-93% while sleeping. Pt wears CPAP at home and doesn't have it here in the hospital. Will elevate HOB while sleeping.

## 2022-01-22 NOTE — Progress Notes (Signed)
Per McVey CNM okay to give night time labetalol if BP greater than 100/60s and HR greater than 60s.

## 2022-01-22 NOTE — Lactation Note (Signed)
This note was copied from a baby's chart. Lactation Consultation Note  Patient Name: Yvette ThompsonD Date: 01/22/2022 Reason for consult: Follow-up assessment;NICU baby;Late-preterm 34-36.6wks;Breastfeeding assistance Age:36 years  Maternal Data Has patient been taught Hand Expression?: Yes Does the patient have breastfeeding experience prior to this delivery?: No  Feeding Mother's Current Feeding Choice: Breast Milk  LATCH Score Latch: Grasps breast easily, tongue down, lips flanged, rhythmical sucking. (was able to latch onto breast without shield but couldn't sustain latch; nipple shield- able to sustain latch)  Audible Swallowing: Spontaneous and intermittent (once a rhythmic sucking pattern was established)  Type of Nipple: Everted at rest and after stimulation  Comfort (Breast/Nipple): Soft / non-tender  Hold (Positioning): Assistance needed to correctly position infant at breast and maintain latch.  LATCH Score: 9  At bedside with parents and baby. Baby swaddled brought to breast in football hold on Right side. Attempted feeding initially without the nipple shield- baby able to latch with a few suckles but not maintain latch, rooting and trying to feed. Nipple shield placed; accepted easily. At first the suckles were random and inconsistent, then within 3 minutes baby established a strong rhythmic sucking pattern with identifiable spontaneous swallows and stayed consistent for at least 10 minutes w/ LC at bedside.  Lactation Tools Discussed/Used Tools: 22F feeding tube / Syringe;Pump Nipple shield size: 20  Interventions Interventions: Assisted with latch;Support pillows;Adjust position;Position options;Education  Encouraged mom to may trial every other feeding- baby may be more alert/active with this pattern with time allowed to rest in between bursts of energy feeding from the breast. Mom agreeable to this plan- Plans to come to every other feed, and continue  pumping every 3 hours with ice before/after pumping, hands on pumping.  Discharge Pump: Personal  Consult Status Consult Status: PRN    Lavonia Drafts 01/22/2022, 5:25 PM

## 2022-01-22 NOTE — Progress Notes (Signed)
Patient ID: Yvette Thompson, female   DOB: April 11, 1985, 36 y.o.   MRN: [id number] 3 day Post op from C/s and complicated with preeclampsia. Was on Magnesium for 12 hours post op and stopped after brisk diuresis. This am intermittent  tachycardia 130-150 within a 15 min time frame . Pt feels well. No SOb, No h/a , vision change. Current vital 136/94 p=119 Pulseox 100% Exam by cnm A; transient tachycardia, asymptomatic.doubt PP cardiomyopathy given cxr and normal BNP. Unlikely PE given current VS and lack of symptoms Possible fluid shifts from resolving severe preeclampsia P: continue monitoring If tachycardia persist will consider cardiology consult

## 2022-01-22 NOTE — Progress Notes (Signed)
Phone call from RN regarding tachycardia but only with pt exertion/ambulation. O2sats remain stable, pulse at rest 90-100s.  BP remains 140/90-100s.   Called Dr Ouida Sills and reviewed update, order received to increase Labetalol 200mg  TID.   Will continue to monitor closely.   [name], CNM 01/22/2022 6:40 PM

## 2022-01-23 ENCOUNTER — Ambulatory Visit: Payer: Self-pay

## 2022-01-23 MED ORDER — NIFEDIPINE ER OSMOTIC RELEASE 90 MG PO TB24: 90.0000 mg | ORAL_TABLET | Freq: Every day | ORAL | 2 refills | Status: AC

## 2022-01-23 MED ORDER — SIMETHICONE 80 MG PO CHEW: 80.0000 mg | CHEWABLE_TABLET | Freq: Four times a day (QID) | ORAL | 0 refills | Status: AC | PRN

## 2022-01-23 MED ORDER — LABETALOL HCL 200 MG PO TABS: 200.0000 mg | ORAL_TABLET | Freq: Three times a day (TID) | ORAL | 2 refills | Status: AC

## 2022-01-23 MED ORDER — ACETAMINOPHEN 500 MG PO TABS: 1000.0000 mg | ORAL_TABLET | Freq: Four times a day (QID) | ORAL | 0 refills | Status: AC

## 2022-01-23 MED ORDER — OXYCODONE HCL 5 MG PO TABS: 5.0000 mg | ORAL_TABLET | ORAL | 0 refills | Status: AC | PRN

## 2022-01-23 MED ORDER — SIMETHICONE 80 MG PO CHEW: 80.0000 mg | CHEWABLE_TABLET | ORAL | 0 refills | Status: AC | PRN

## 2022-01-23 MED ORDER — SENNOSIDES-DOCUSATE SODIUM 8.6-50 MG PO TABS: 2.0000 | ORAL_TABLET | Freq: Every evening | ORAL | Status: AC | PRN

## 2022-01-23 MED ORDER — IBUPROFEN 600 MG PO TABS: 600.0000 mg | ORAL_TABLET | Freq: Four times a day (QID) | ORAL | 0 refills | Status: AC

## 2022-01-23 NOTE — Plan of Care (Signed)
Pt walking to and from special care nursery. Pain well controlled. Some breast engorgement from pumping and feeding baby, mom applies ice for comfort. Pt to discharge today. Uterus and bleeding well controlled. Will continue to monitor.

## 2022-01-23 NOTE — Lactation Note (Signed)
This note was copied from a baby's chart. Lactation Consultation Note  Patient Name: Yvette Thompson [id number] Date: 01/23/2022 Reason for consult: Follow-up assessment;Primapara Age:37 days  Maternal Data  Mom was going to pump with motif pump loaned to her, did not have all the parts, so I rented her  Symphony rental x 2 wks, rental agreement completed, documentation completed and paid with cc which I processed  Feeding Mother's Current Feeding Choice: Breast Milk  LATCH Score                    Lactation Tools Discussed/Used Tools: 49F feeding tube / Syringe Nipple shield size: 20 Breast pump type: Double-Electric Breast Pump Pumped volume: 30 mL  Interventions    Discharge Pump: Rented (symphony pump rented)  Consult Status Consult Status: Follow-up Date: 01/24/22 Follow-up type: In-patient    Ferol Luz 01/23/2022, 7:13 PM

## 2022-01-23 NOTE — Progress Notes (Signed)
Pt given discharge instructions, all questions answered. Verbalized understanding. SL removed. Pt packing up room. Wheelchair to be used once pt finishes visit with baby in special care nursery.

## 2022-01-23 NOTE — Lactation Note (Addendum)
This note was copied from a baby's chart. Lactation Consultation Note  Patient Name: Yvette Thompson [id number] Date: 01/23/2022 Reason for consult: Follow-up assessment;Primapara;NICU baby;Late-preterm 34-36.6wks Age:37 days  Maternal Data  Warm cloths to breasts before pumping, pumped with massage to full firm breasts, obtained 60 cc in initiation mode and then maintenance mode, ice packs to breast after , breasts softer after but still firm, recommend pumping every 2 hrs until engorement decreases  Feeding Mother's Current Feeding Choice: Breast Milk Nipple Type: Dr. Lavena Bullion  [facility] Score                    Lactation Tools Discussed/Used Tools: Bottle;64F feeding tube / Syringe Breast pump type: Double-Electric Breast Pump Pump Education: Milk Storage Reason for Pumping: to provide EBM for baby in SCN Pumping frequency: q2-3h Pumped volume: 60 mL  Interventions  Mom wants to try borrowed Motif pump at next pumping session, if not effective, would like to rent symphony pump [place] name updated on white board Discharge Pump: DEBP WIC Program: No  Consult Status Consult Status: PRN    Ferol Luz 01/23/2022, 3:44 PM

## 2022-01-24 ENCOUNTER — Ambulatory Visit: Payer: Self-pay

## 2022-01-24 NOTE — Lactation Note (Signed)
This note was copied from a baby's chart. Lactation Consultation Note  Patient Name: Yvette Thompson [id number] Date: 01/24/2022 Reason for consult: Follow-up assessment;Mother's request;Primapara;Late-preterm 34-36.6wks;Infant < 6lbs Age:37 days  Maternal Data Has patient been taught Hand Expression?: Yes Does the patient have breastfeeding experience prior to this delivery?: No  Mom experiencing engorgement. Worked with [facility] yesterday for relief. She is using warmth before/while pumping, ice afterwards, and pumping every 2-3 hours.  Feeding Mother's Current Feeding Choice: Breast Milk  LATCH Score Latch: Grasps breast easily, tongue down, lips flanged, rhythmical sucking.  Audible Swallowing: A few with stimulation  Type of Nipple: Everted at rest and after stimulation  Comfort (Breast/Nipple): Engorged, cracked, bleeding, large blisters, severe discomfort  Hold (Positioning): No assistance needed to correctly position infant at breast.  LATCH Score: 7  LC at bedside; baby had been placed in football hold at L breast. After a few minutes of attempt baby finally latched and had rhythmic sucking pattern with spontaneous swallows for approximately 10 minutes. Baby latched well and deep to the nipple shield, full cheeks.  Lactation Tools Discussed/Used Tools: 11F feeding tube / Syringe;Nipple Shields Nipple shield size: 20 Breast pump type: Double-Electric Breast Pump Reason for Pumping: SCN baby Pumping frequency: q 2-3 hours Pumped volume: 80 mL  Interventions Interventions: Assisted with latch;Support pillows  Discharge Pump: Personal;Rented (Motif; Symphony)  Consult Status Consult Status: PRN    Yvette Thompson 01/24/2022, 12:04 PM

## 2022-01-28 ENCOUNTER — Ambulatory Visit: Payer: Self-pay

## 2022-01-28 NOTE — Lactation Note (Signed)
This note was copied from a baby's chart. Lactation Consultation Note  Patient Name: Boy Sundee Garland [id number] Date: 01/28/2022 Reason for consult: NICU baby;Late-preterm 34-36.6wks Age:37 days  LC called to the room to assist with a feed at breast. Parents were triple feeding over night and are feeling exhausted and overwhelmed. Mother is pumping at least 8x's/24 hours. She has been placing baby to breast for at least 20 minutes and then supplementing him with a bottle and then also pumping. Discussed ways to optimize feeds, nutritive versus non-nutritive sucking and when to move to the next step. Also encouraged not to place him to breast every feed at night and some of the feeds overnight could be just bottle and pump. Also encouraged not to try to wake him for 10 minutes and then breastfeed as this is also taking time and making him less successful at the breast.   Baby was placed on the left in football, suboptimal positioning and baby is slipping off the nipple. Taught how to position pillows to make him more at breast height, tummy to mummy with his ear, shoulder and hip in line. Encouraged to sandwich the breast tissue in the same direction as his smile, he was able to latch quickly and maintain better. We did a pre-post weighted feed and he transferred 71ml's in 10 minutes on the left. Baby requires stimulation with breast massage for the 2nd half of the feed. FOB then bottle fed in side lying position with Dr. Saul Fordyce bottle and he took another 43ml's for a total of 49ml's. Mother pumped using [employer]. Discussed her nipple size is small and would encourage her to use 21 mm flanges, her nipples stretch very far to the end of the barrel and a lot of the areola is being pulled into the tunnel. Encouraged to pump for 20 minutes, or 3-5 minutes past the milk spraying. She stated she had not been doing that.   Encouraged [place] visit tomorrow as well and see how he transfers again at breast.   Maternal  Data Does the patient have breastfeeding experience prior to this delivery?: No  Feeding Mother's Current Feeding Choice: Breast Milk  LATCH Score Latch: Repeated attempts needed to sustain latch, nipple held in mouth throughout feeding, stimulation needed to elicit sucking reflex. (begins to tire after 6 min)  Audible Swallowing: Spontaneous and intermittent  Type of Nipple: Everted at rest and after stimulation (small)  Comfort (Breast/Nipple): Soft / non-tender  Hold (Positioning): Assistance needed to correctly position infant at breast and maintain latch.  LATCH Score: 8   Lactation Tools Discussed/Used Breast pump type: Double-Electric Breast Pump Pump Education: Setup, frequency, and cleaning;Milk Storage Reason for Pumping: baby remains in NICU Pumping frequency: 8x's/24hrs Pumped volume: 120 mL  Interventions Interventions: Breast feeding basics reviewed;Assisted with latch;Adjust position;Support pillows;DEBP;Pace feeding;Coconut oil  Discharge Discharge Education: Warning signs for feeding baby;Outpatient recommendation Pump: Rented (Has [employer] at home, is renting a Symphony)  Consult Status Consult Status: NICU follow-up Date: 01/29/22    Krystol Rocco D Thelma Viana 01/28/2022, 2:18 PM

## 2022-02-04 ENCOUNTER — Encounter: Payer: Self-pay | Admitting: Obstetrics and Gynecology

## 2022-03-03 ENCOUNTER — Telehealth: Payer: Self-pay | Admitting: Lactation Services

## 2022-03-03 NOTE — Telephone Encounter (Signed)
Phone call made to renew pump. Credit card information written down is being declined. Verified the numbers with mom over the phone; Security code and expiration dates were written down wrong.  Card information will be ran again for additional 2 weeks.

## 2022-03-17 ENCOUNTER — Other Ambulatory Visit: Payer: Self-pay | Admitting: Obstetrics and Gynecology

## 2022-03-17 DIAGNOSIS — N61 Mastitis without abscess: Secondary | ICD-10-CM

## 2022-03-17 DIAGNOSIS — N611 Abscess of the breast and nipple: Secondary | ICD-10-CM

## 2023-09-26 IMAGING — MG DIGITAL DIAGNOSTIC BILAT W/ TOMO W/ CAD
8 of 14 series · 8 of 40 positions shown · non-contrast
Comparison: None.

CLINICAL DATA: 35-year-old female with bilateral physician palpated
lumps.



[L CC synth-2D (1 of 2)]
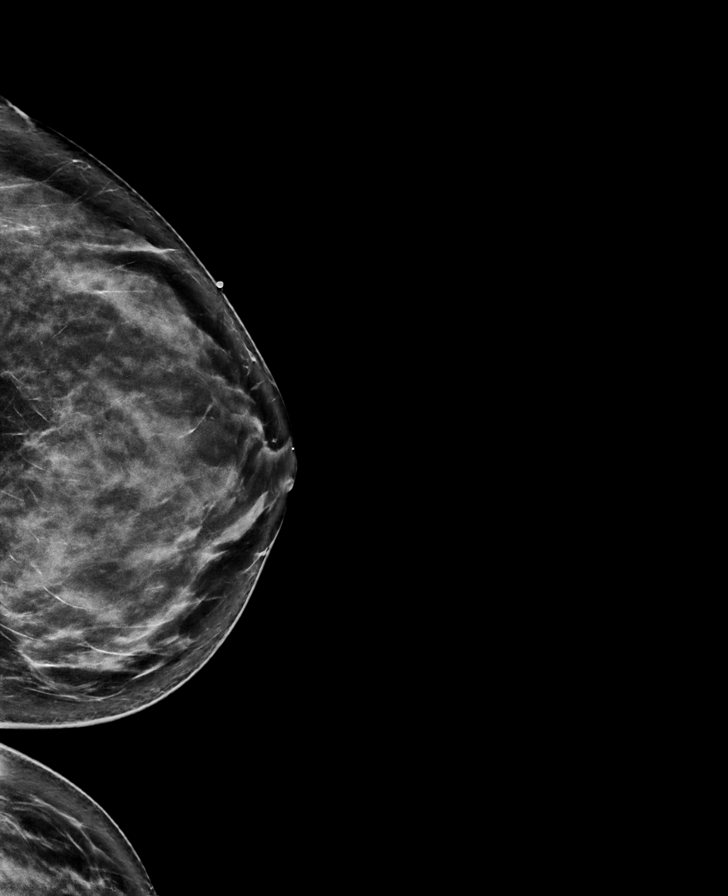

[L CC synth-2D (2 of 2)]
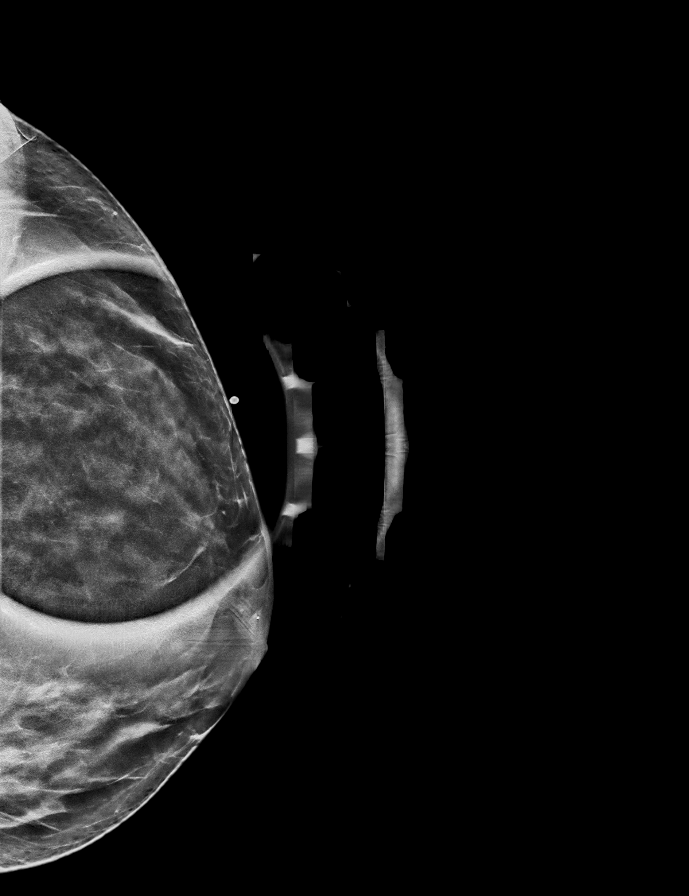

[R CC synth-2D (1 of 2)]
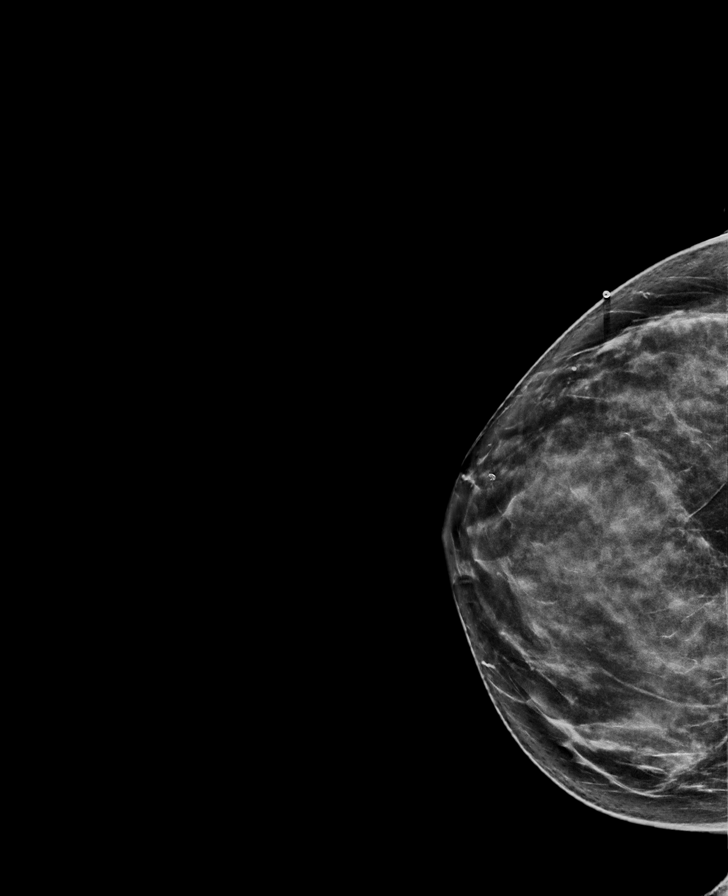

[R CC synth-2D (2 of 2)]
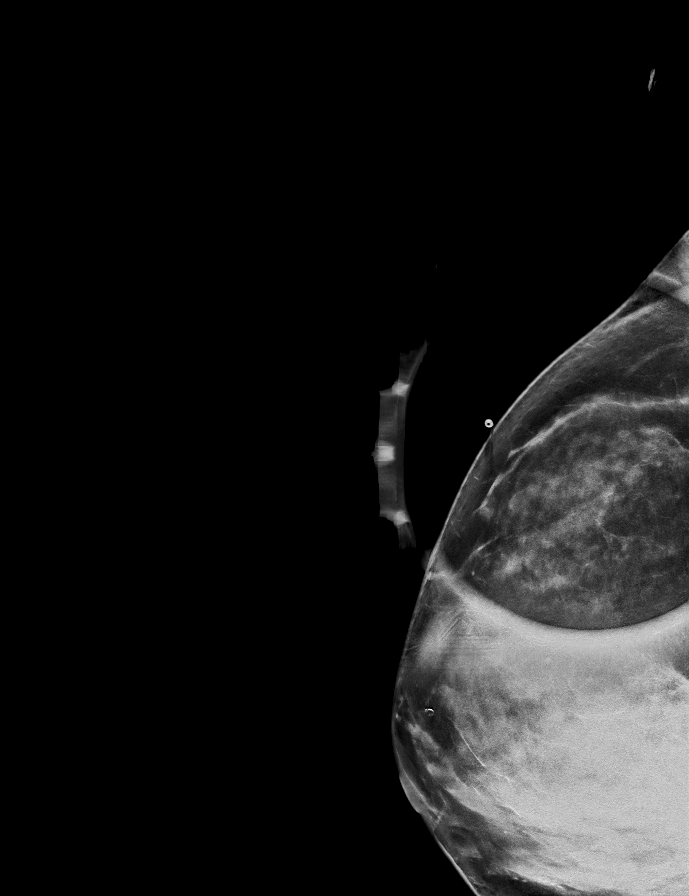

[R MLO synth-2D]
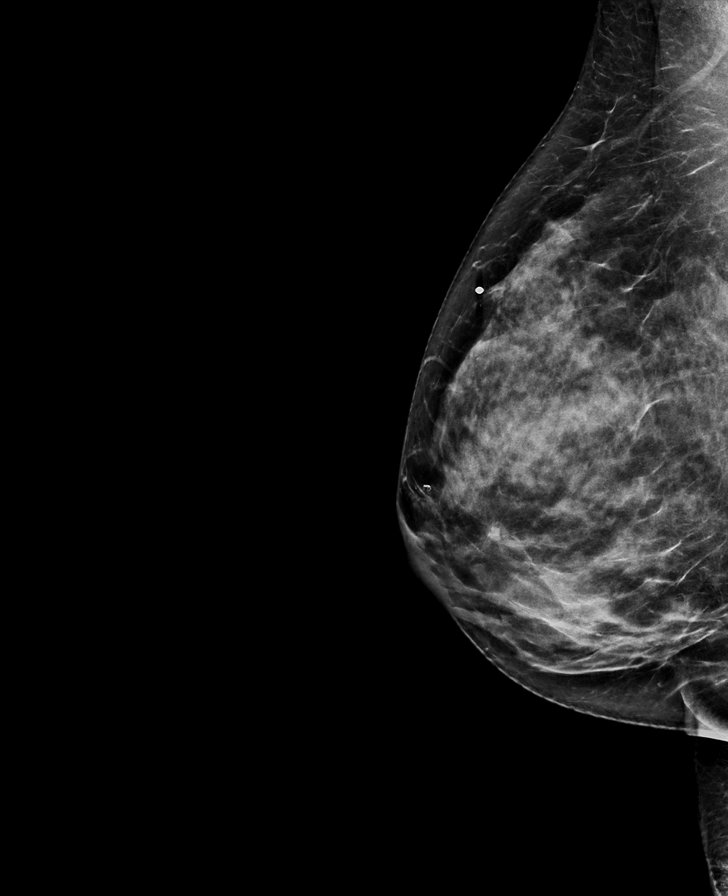

[L MLO synth-2D (1 of 2)]
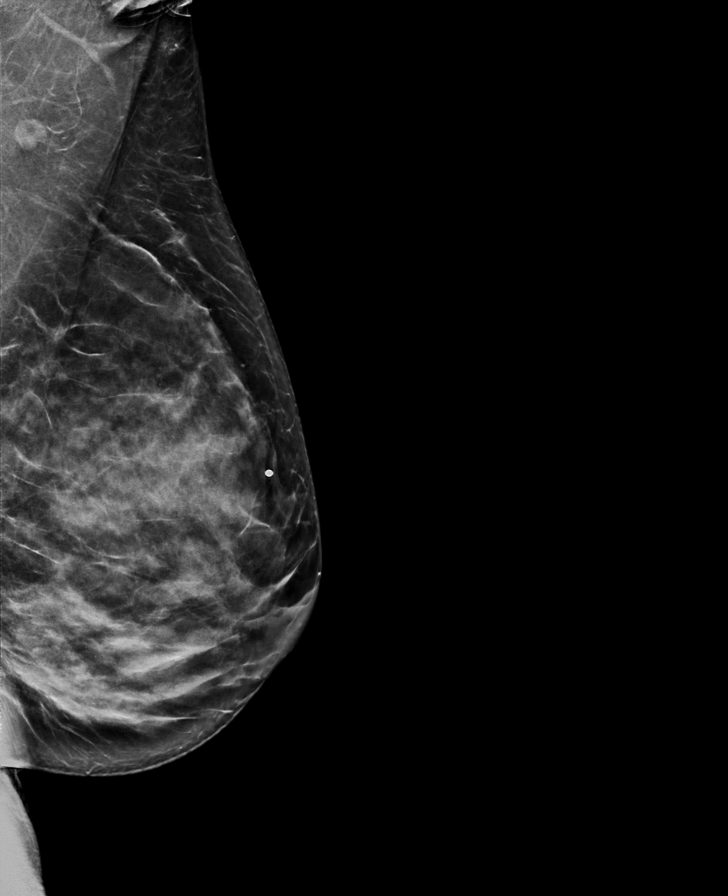

[L MLO synth-2D (2 of 2)]
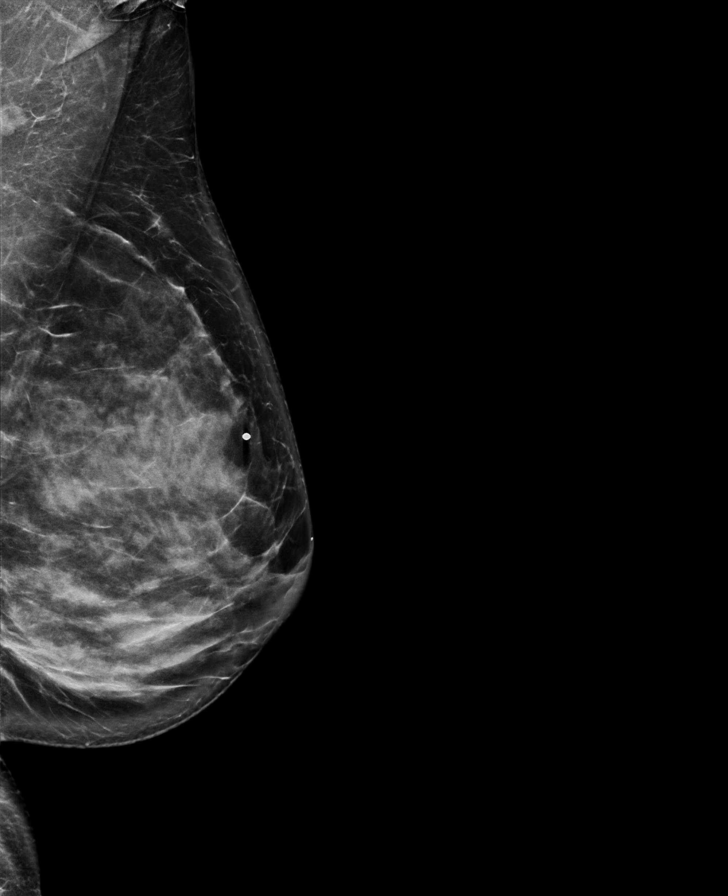

[L CC tomo · tomo slice 39/78.0]
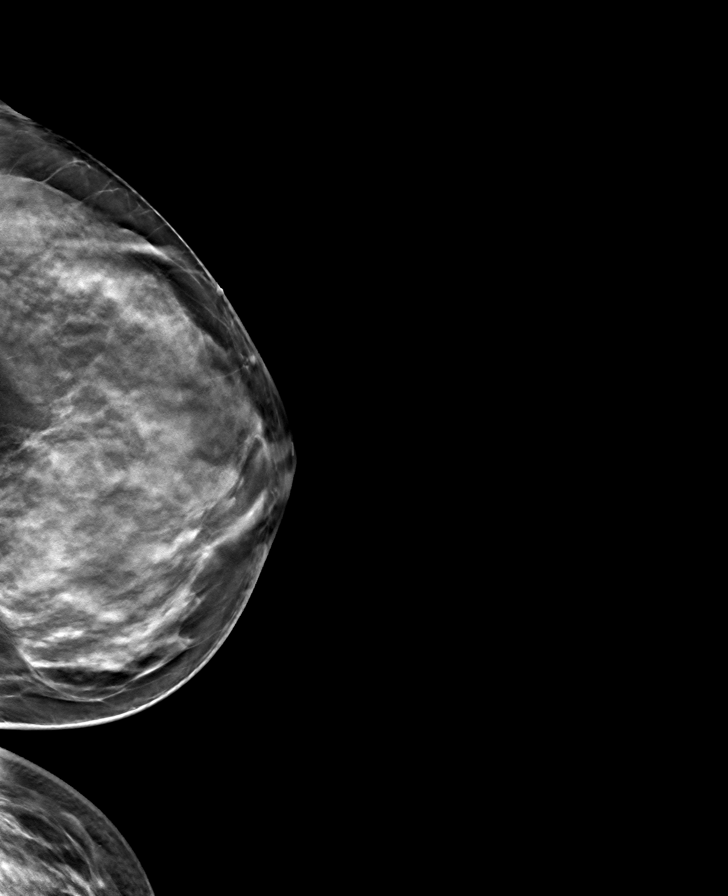

[8 of 40 positions shown; findings below may reference images not displayed]

ACR Breast Density Category d: The breast tissue is extremely dense,
which lowers the sensitivity of mammography.
FINDINGS: Radiopaque BBs were placed at the site of the patient's bilateral
lumps in the upper-outer breast. No focal or suspicious mammographic
findings are identified in either breast.

Targeted ultrasound is performed, showing dense fibroglandular
tissue without focal or suspicious sonographic findings in the
bilateral breasts.
IMPRESSION: Unremarkable mammographic and sonographic evaluation of the
bilateral breasts.

RECOMMENDATION:
1. Clinical follow-up recommended for the palpable area of concern
in the bilateral breasts. Any further workup should be based on
clinical grounds.
2. Screening mammogram at age 40 unless there are persistent or
intervening clinical concerns. (Code:[id number])

I have discussed the findings and recommendations with the patient.
If applicable, a reminder letter will be sent to the patient
regarding the next appointment.

BI-RADS CATEGORY  1: Negative.

## 2024-05-04 ENCOUNTER — Other Ambulatory Visit: Payer: Self-pay | Admitting: Neurology

## 2024-05-04 DIAGNOSIS — G43719 Chronic migraine without aura, intractable, without status migrainosus: Secondary | ICD-10-CM

## 2024-05-11 ENCOUNTER — Ambulatory Visit: Admission: RE | Admit: 2024-05-11 | Source: Ambulatory Visit

## 2024-05-21 ENCOUNTER — Ambulatory Visit
